# Patient Record
Sex: Female | Born: 1985 | Race: White | Hispanic: No | Marital: Married | Smoking: Never smoker
Health system: Southern US, Community
[De-identification: ages and names within clinical notes are randomized; demographics above are authoritative.]

## PROBLEM LIST (undated history)

## (undated) DIAGNOSIS — K429 Umbilical hernia without obstruction or gangrene: Secondary | ICD-10-CM

## (undated) DIAGNOSIS — T8332XA Displacement of intrauterine contraceptive device, initial encounter: Secondary | ICD-10-CM

## (undated) DIAGNOSIS — G43909 Migraine, unspecified, not intractable, without status migrainosus: Secondary | ICD-10-CM

## (undated) DIAGNOSIS — E7800 Pure hypercholesterolemia, unspecified: Secondary | ICD-10-CM

## (undated) DIAGNOSIS — K449 Diaphragmatic hernia without obstruction or gangrene: Secondary | ICD-10-CM

## (undated) DIAGNOSIS — Z87898 Personal history of other specified conditions: Secondary | ICD-10-CM

## (undated) DIAGNOSIS — K219 Gastro-esophageal reflux disease without esophagitis: Secondary | ICD-10-CM

## (undated) DIAGNOSIS — R519 Headache, unspecified: Secondary | ICD-10-CM

## (undated) HISTORY — PX: WISDOM TOOTH EXTRACTION: SHX21

## (undated) HISTORY — DX: Migraine, unspecified, not intractable, without status migrainosus: G43.909

## (undated) HISTORY — DX: Pure hypercholesterolemia, unspecified: E78.00

---

## 2010-10-09 DIAGNOSIS — Z8669 Personal history of other diseases of the nervous system and sense organs: Secondary | ICD-10-CM

## 2010-10-09 HISTORY — DX: Personal history of other diseases of the nervous system and sense organs: Z86.69

## 2011-01-09 ENCOUNTER — Ambulatory Visit (INDEPENDENT_AMBULATORY_CARE_PROVIDER_SITE_OTHER): Payer: BC Managed Care – PPO | Admitting: Family Medicine

## 2011-01-09 ENCOUNTER — Encounter: Payer: Self-pay | Admitting: Family Medicine

## 2011-01-09 DIAGNOSIS — R748 Abnormal levels of other serum enzymes: Secondary | ICD-10-CM

## 2011-01-09 DIAGNOSIS — G43909 Migraine, unspecified, not intractable, without status migrainosus: Secondary | ICD-10-CM | POA: Insufficient documentation

## 2011-01-09 DIAGNOSIS — E7800 Pure hypercholesterolemia, unspecified: Secondary | ICD-10-CM | POA: Insufficient documentation

## 2011-01-09 NOTE — Patient Instructions (Signed)
Can go for labs anytime, Mon-Friday. They open at 8AM.   We will call you the results.

## 2011-01-09 NOTE — Progress Notes (Signed)
  Subjective:    Patient ID: Abigail Hughes, female    DOB: Apr 15, 1986, 25 y.o.   MRN: [id number]  HPI  Had bloodwork drawn at work and told her lipids are high.  Moved to the area 2 years ago. Brought in labs done in 12-15-2010. No abdominal pain. Feels healthy. No liver dz in the family. occ drink alcohol but not daily. Has changed her diet in the last year and does eat more fatty foods since got married.  Review of Systems   BP 110/62  Pulse 84  Ht 5\' 5"  (1.651 m)  Wt 130 lb (58.968 kg)  BMI 21.63 kg/m2    No Known Allergies  Past Medical History  Diagnosis Date  . High blood cholesterol level     recent lab results    History reviewed. No pertinent past surgical history.  History   Social History  . Marital Status: Married    Spouse Name: Caprice Mccaffrey    Number of Children: N/A  . Years of Education: Masters    Occupational History  . Teacher      [employer]   Social History Main Topics  . Smoking status: Never Smoker   . Smokeless tobacco: Not on file  . Alcohol Use: 1.8 oz/week    3 Glasses of wine per week  . Drug Use: No  . Sexually Active: Yes -- Female partner(s)    Birth Control/ Protection: Pill   Other Topics Concern  . Not on file   Social History Narrative  . No narrative on file    Family History  Problem Relation Age of Onset  . Lymphoma Father   . Hyperlipidemia Father   . Hypertension      grandparents    Current outpatient prescriptions:Norethin Ace-Eth Estrad-FE (LOESTRIN 24 FE PO), Take by mouth.  , Disp: , Rfl: ;  SUMAtriptan (IMITREX) 100 MG tablet, Take 100 mg by mouth every 2 (two) hours as needed.  , Disp: , Rfl:      Objective:   Physical Exam  Constitutional: She appears well-developed and well-nourished.  HENT:  Head: Normocephalic.  Neck: Normal range of motion. Neck supple. No thyromegaly present.  Cardiovascular: Normal rate and regular rhythm.   Pulmonary/Chest: Effort normal and breath sounds normal.         Assessment & Plan:  Elevated LIver enezymes - Will recheck in 2 weeks off of all tylenol products and alcohol. If still elevated then check Acute Hep panel.

## 2011-01-09 NOTE — Assessment & Plan Note (Signed)
She really is just above goal. + family hx. Will recheck now that she has made some dietary changes.

## 2011-01-18 ENCOUNTER — Telehealth: Payer: Self-pay | Admitting: Family Medicine

## 2011-01-18 LAB — HEPATIC FUNCTION PANEL
ALT: 15 U/L (ref 0–35)
Alkaline Phosphatase: 67 U/L (ref 39–117)
Indirect Bilirubin: 0.2 mg/dL (ref 0.0–0.9)
Total Protein: 6.5 g/dL (ref 6.0–8.3)

## 2011-01-18 NOTE — Telephone Encounter (Signed)
Call pt: LDL is OK, in the 120s which is above goal but not high enough for medicine. Liver function is back to  Nl. Recheck liver in 6 months.

## 2011-01-18 NOTE — Telephone Encounter (Signed)
Left message on vm with results  

## 2011-03-01 ENCOUNTER — Encounter: Payer: Self-pay | Admitting: Family Medicine

## 2012-10-07 ENCOUNTER — Telehealth: Payer: Self-pay | Admitting: Family Medicine

## 2012-10-07 NOTE — Telephone Encounter (Signed)
Call pt: she is using her rescue meds a lot for her migraines. She really needs to be on prophylaxis to help prevent her migraines. Encourage her to make an OV.

## 2012-10-08 NOTE — Telephone Encounter (Signed)
Called patient, unable to leave a message

## 2012-10-14 NOTE — Telephone Encounter (Signed)
Mailed letter advising patient to call us in regards to her medication.

## 2013-10-14 ENCOUNTER — Ambulatory Visit: Payer: Self-pay | Admitting: Certified Nurse Midwife

## 2013-10-27 ENCOUNTER — Encounter: Payer: Self-pay | Admitting: Certified Nurse Midwife

## 2013-10-28 ENCOUNTER — Encounter: Payer: Self-pay | Admitting: Certified Nurse Midwife

## 2013-10-28 ENCOUNTER — Ambulatory Visit (INDEPENDENT_AMBULATORY_CARE_PROVIDER_SITE_OTHER): Payer: BC Managed Care – PPO | Admitting: Certified Nurse Midwife

## 2013-10-28 VITALS — BP 96/60 | HR 71 | Resp 16 | Ht 63.75 in | Wt 139.0 lb

## 2013-10-28 DIAGNOSIS — Z01419 Encounter for gynecological examination (general) (routine) without abnormal findings: Secondary | ICD-10-CM

## 2013-10-28 DIAGNOSIS — Z309 Encounter for contraceptive management, unspecified: Secondary | ICD-10-CM

## 2013-10-28 DIAGNOSIS — Z0000 Encounter for general adult medical examination without abnormal findings: Secondary | ICD-10-CM

## 2013-10-28 LAB — POCT URINALYSIS DIPSTICK
BILIRUBIN UA: NEGATIVE
Blood, UA: NEGATIVE
GLUCOSE UA: NEGATIVE
Ketones, UA: NEGATIVE
LEUKOCYTES UA: NEGATIVE
NITRITE UA: NEGATIVE
Protein, UA: NEGATIVE
UROBILINOGEN UA: NEGATIVE
pH, UA: 5

## 2013-10-28 MED ORDER — NORETHINDRONE 0.35 MG PO TABS: 1.0000 | ORAL_TABLET | Freq: Every day | ORAL | Status: DC

## 2013-10-28 NOTE — Progress Notes (Signed)
28 y.o. [name]000 Married Caucasian Fe here for annual exam. Periods scant with Micronor until this past period which was slightly heavier, no issues. No health issues today. Sees PCP prn for labs.  Patient's last menstrual period was 10/27/2013.          Sexually active: yes  The current method of family planning is oral progesterone-only contraceptive.    Exercising: yes  running & strength Smoker:  no  Health Maintenance: Pap:  09-19-10 neg MMG:  none Colonoscopy:  none BMD:   none TDaP:  2008 Labs: Poct urine-neg Self breast exam: done occ   reports that she has never smoked. She does not have any smokeless tobacco history on file. She reports that she drinks about 1.2 ounces of alcohol per week. She reports that she does not use illicit drugs.  Past Medical History  Diagnosis Date  . High blood cholesterol level     2012 lab results, tested again with normal levels  . Migraines     no aura    History reviewed. No pertinent past surgical history.  Current Outpatient Prescriptions  Medication Sig Dispense Refill  . norethindrone (MICRONOR,CAMILA,ERRIN) 0.35 MG tablet Take 1 tablet by mouth daily.      . RELPAX 40 MG tablet as needed.       No current facility-administered medications for this visit.    Family History  Problem Relation Age of Onset  . Lymphoma Father   . Hyperlipidemia Father   . Hypertension Maternal Grandmother     ROS:  Pertinent items are noted in HPI.  Otherwise, a comprehensive ROS was negative.  Exam:   BP 96/60  Pulse 71  Resp 16  Ht 5' 4.25" (1.632 m)  Wt 139 lb (63.05 kg)  BMI 23.67 kg/m2  LMP 10/27/2013 Height: 5' 4.25" (163.2 cm)  Ht Readings from Last 3 Encounters:  10/28/13 5' 4.25" (1.632 m)  01/09/11 5\' 5"  (1.651 m)    General appearance: alert, cooperative and appears stated age Head: Normocephalic, without obvious abnormality, atraumatic Neck: no adenopathy, supple, symmetrical, trachea midline and thyroid normal to  inspection and palpation Lungs: clear to auscultation bilaterally Breasts: normal appearance, no masses or tenderness, No nipple retraction or dimpling, No nipple discharge or bleeding, No axillary or supraclavicular adenopathy Heart: regular rate and rhythm Abdomen: soft, non-tender; no masses,  no organomegaly Extremities: extremities normal, atraumatic, no cyanosis or edema Skin: Skin color, texture, turgor normal. No rashes or lesions Lymph nodes: Cervical, supraclavicular, and axillary nodes normal. No abnormal inguinal nodes palpated Neurologic: Grossly normal   Pelvic: External genitalia:  no lesions              Urethra:  normal appearing urethra with no masses, tenderness or lesions              Bartholin's and Skene's: normal                 Vagina: normal appearing vagina with normal color and discharge, no lesions              Cervix: normal, non tender, scant blood present              Pap taken: yes Bimanual Exam:  Uterus:  normal size, contour, position, consistency, mobility, non-tender and anteverted              Adnexa: normal adnexa and no mass, fullness, tenderness  Rectovaginal: Confirms               Anus:  deferred  A:  Well Woman with normal exam  Contraception POP due to migraine headache  P:   Reviewed health and wellness pertinent to exam  Rx Micronor see order  Pap smear as per guidelines   pap smear taken today with reflex  counseled on breast self exam, adequate intake of calcium and vitamin D, diet and exercise  return annually or prn  An After Visit Summary was printed and given to the patient.

## 2013-10-28 NOTE — Patient Instructions (Signed)
General topics  Next pap or exam is  due in 1 year Take a Women's multivitamin Take 1200 mg. of calcium daily - prefer dietary If any concerns in interim to call back  Breast Self-Awareness Practicing breast self-awareness may pick up problems early, prevent significant medical complications, and possibly save your life. By practicing breast self-awareness, you can become familiar with how your breasts look and feel and if your breasts are changing. This allows you to notice changes early. It can also offer you some reassurance that your breast health is good. One way to learn what is normal for your breasts and whether your breasts are changing is to do a breast self-exam. If you find a lump or something that was not present in the past, it is best to contact your caregiver right away. Other findings that should be evaluated by your caregiver include nipple discharge, especially if it is bloody; skin changes or reddening; areas where the skin seems to be pulled in (retracted); or new lumps and bumps. Breast pain is seldom associated with cancer (malignancy), but should also be evaluated by a caregiver. BREAST SELF-EXAM The best time to examine your breasts is 5 7 days after your menstrual period is over.  ExitCare Patient Information 2013 [employer].   Exercise to Stay Healthy Exercise helps you become and stay healthy. EXERCISE IDEAS AND TIPS Choose exercises that:  You enjoy.  Fit into your day. You do not need to exercise really hard to be healthy. You can do exercises at a slow or medium level and stay healthy. You can:  Stretch before and after working out.  Try yoga, Pilates, or tai chi.  Lift weights.  Walk fast, swim, jog, run, climb stairs, bicycle, dance, or rollerskate.  Take aerobic classes. Exercises that burn about 150 calories:  Running 1  miles in 15 minutes.  Playing volleyball for 45 to 60 minutes.  Washing and waxing a car for 45 to 60  minutes.  Playing touch football for 45 minutes.  Walking 1  miles in 35 minutes.  Pushing a stroller 1  miles in 30 minutes.  Playing basketball for 30 minutes.  Raking leaves for 30 minutes.  Bicycling 5 miles in 30 minutes.  Walking 2 miles in 30 minutes.  Dancing for 30 minutes.  Shoveling snow for 15 minutes.  Swimming laps for 20 minutes.  Walking up stairs for 15 minutes.  Bicycling 4 miles in 15 minutes.  Gardening for 30 to 45 minutes.  Jumping rope for 15 minutes.  Washing windows or floors for 45 to 60 minutes. Document Released: 10/28/2010 Document Revised: 12/18/2011 Document Reviewed: 10/28/2010 ExitCare Patient Information 2013 [employer].   Other topics ( that may be useful information):    Sexually Transmitted Disease Sexually transmitted disease (STD) refers to any infection that is passed from person to person during sexual activity. This may happen by way of saliva, semen, blood, vaginal mucus, or urine. Common STDs include:  Gonorrhea.  Chlamydia.  Syphilis.  HIV/AIDS.  Genital herpes.  Hepatitis B and C.  Trichomonas.  Human papillomavirus (HPV).  Pubic lice. CAUSES  An STD may be spread by bacteria, virus, or parasite. A person can get an STD by:  Sexual intercourse with an infected person.  Sharing sex toys with an infected person.  Sharing needles with an infected person.  Having intimate contact with the genitals, mouth, or rectal areas of an infected person. SYMPTOMS  Some people may not have any symptoms, but   they can still pass the infection to others. Different STDs have different symptoms. Symptoms include:  Painful or bloody urination.  Pain in the pelvis, abdomen, vagina, anus, throat, or eyes.  Skin rash, itching, irritation, growths, or sores (lesions). These usually occur in the genital or anal area.  Abnormal vaginal discharge.  Penile discharge in men.  Soft, flesh-colored skin growths in the  genital or anal area.  Fever.  Pain or bleeding during sexual intercourse.  Swollen glands in the groin area.  Yellow skin and eyes (jaundice). This is seen with hepatitis. DIAGNOSIS  To make a diagnosis, your caregiver may:  Take a medical history.  Perform a physical exam.  Take a specimen (culture) to be examined.  Examine a sample of discharge under a microscope.  Perform blood test TREATMENT   Chlamydia, gonorrhea, trichomonas, and syphilis can be cured with antibiotic medicine.  Genital herpes, hepatitis, and HIV can be treated, but not cured, with prescribed medicines. The medicines will lessen the symptoms.  Genital warts from HPV can be treated with medicine or by freezing, burning (electrocautery), or surgery. Warts may come back.  HPV is a virus and cannot be cured with medicine or surgery.However, abnormal areas may be followed very closely by your caregiver and may be removed from the cervix, vagina, or vulva through office procedures or surgery. If your diagnosis is confirmed, your recent sexual partners need treatment. This is true even if they are symptom-free or have a negative culture or evaluation. They should not have sex until their caregiver says it is okay. HOME CARE INSTRUCTIONS  All sexual partners should be informed, tested, and treated for all STDs.  Take your antibiotics as directed. Finish them even if you start to feel better.  Only take over-the-counter or prescription medicines for pain, discomfort, or fever as directed by your caregiver.  Rest.  Eat a balanced diet and drink enough fluids to keep your urine clear or pale yellow.  Do not have sex until treatment is completed and you have followed up with your caregiver. STDs should be checked after treatment.  Keep all follow-up appointments, Pap tests, and blood tests as directed by your caregiver.  Only use latex condoms and water-soluble lubricants during sexual activity. Do not use  petroleum jelly or oils.  Avoid alcohol and illegal drugs.  Get vaccinated for HPV and hepatitis. If you have not received these vaccines in the past, talk to your caregiver about whether one or both might be right for you.  Avoid risky sex practices that can break the skin. The only way to avoid getting an STD is to avoid all sexual activity.Latex condoms and dental dams (for oral sex) will help lessen the risk of getting an STD, but will not completely eliminate the risk. SEEK MEDICAL CARE IF:   You have a fever.  You have any new or worsening symptoms. Document Released: 12/16/2002 Document Revised: 12/18/2011 Document Reviewed: 12/23/2010 [facility] Patient Information 2013 [employer].    Domestic Abuse You are being battered or abused if someone close to you hits, pushes, or physically hurts you in any way. You also are being abused if you are forced into activities. You are being sexually abused if you are forced to have sexual contact of any kind. You are being emotionally abused if you are made to feel worthless or if you are constantly threatened. It is important to remember that help is available. No one has the right to abuse you. PREVENTION OF FURTHER  ABUSE  Learn the warning signs of danger. This varies with situations but may include: the use of alcohol, threats, isolation from friends and family, or forced sexual contact. Leave if you feel that violence is going to occur.  If you are attacked or beaten, report it to the police so the abuse is documented. You do not have to press charges. The police can protect you while you or the attackers are leaving. Get the officer's name and badge number and a copy of the report.  Find someone you can trust and tell them what is happening to you: your caregiver, a nurse, clergy member, close friend or family member. Feeling ashamed is natural, but remember that you have done nothing wrong. No one deserves abuse. Document Released:  09/22/2000 Document Revised: 12/18/2011 Document Reviewed: 12/01/2010 ExitCare Patient Information 2013 [employer].    How Much is Too Much Alcohol? Drinking too much alcohol can cause injury, accidents, and health problems. These types of problems can include:   Car crashes.  Falls.  Family fighting (domestic violence).  Drowning.  Fights.  Injuries.  Burns.  Damage to certain organs.  Having a baby with birth defects. ONE DRINK CAN BE TOO MUCH WHEN YOU ARE:  Working.  Pregnant or breastfeeding.  Taking medicines. Ask your doctor.  Driving or planning to drive. If you or someone you know has a drinking problem, get help from a doctor.  Document Released: 07/22/2009 Document Revised: 12/18/2011 Document Reviewed: 07/22/2009 ExitCare Patient Information 2013 [employer].   Smoking Hazards Smoking cigarettes is extremely bad for your health. Tobacco smoke has over 200 known poisons in it. There are over 60 chemicals in tobacco smoke that cause cancer. Some of the chemicals found in cigarette smoke include:   Cyanide.  Benzene.  Formaldehyde.  Methanol (wood alcohol).  Acetylene (fuel used in welding torches).  Ammonia. Cigarette smoke also contains the poisonous gases nitrogen oxide and carbon monoxide.  Cigarette smokers have an increased risk of many serious medical problems and Smoking causes approximately:  90% of all lung cancer deaths in men.  80% of all lung cancer deaths in women.  90% of deaths from chronic obstructive lung disease. Compared with nonsmokers, smoking increases the risk of:  Coronary heart disease by 2 to 4 times.  Stroke by 2 to 4 times.  Men developing lung cancer by 23 times.  Women developing lung cancer by 13 times.  Dying from chronic obstructive lung diseases by 12 times.  . Smoking is the most preventable cause of death and disease in our society.  WHY IS SMOKING ADDICTIVE?  Nicotine is the chemical  agent in tobacco that is capable of causing addiction or dependence.  When you smoke and inhale, nicotine is absorbed rapidly into the bloodstream through your lungs. Nicotine absorbed through the lungs is capable of creating a powerful addiction. Both inhaled and non-inhaled nicotine may be addictive.  Addiction studies of cigarettes and spit tobacco show that addiction to nicotine occurs mainly during the teen years, when young people begin using tobacco products. WHAT ARE THE BENEFITS OF QUITTING?  There are many health benefits to quitting smoking.   Likelihood of developing cancer and heart disease decreases. Health improvements are seen almost immediately.  Blood pressure, pulse rate, and breathing patterns start returning to normal soon after quitting. QUITTING SMOKING   American Lung Association - 1-800-LUNGUSA  American Cancer Society - 1-800-ACS-2345 Document Released: 11/02/2004 Document Revised: 12/18/2011 Document Reviewed: 07/07/2009 ExitCare Patient Information 2013 ExitCare,   LLC.   Stress Management Stress is a state of physical or mental tension that often results from changes in your life or normal routine. Some common causes of stress are:  Death of a loved one.  Injuries or severe illnesses.  Getting fired or changing jobs.  Moving into a new home. Other causes may be:  Sexual problems.  Business or financial losses.  Taking on a large debt.  Regular conflict with someone at home or at work.  Constant tiredness from lack of sleep. It is not just bad things that are stressful. It may be stressful to:  Win the lottery.  Get married.  Buy a new car. The amount of stress that can be easily tolerated varies from person to person. Changes generally cause stress, regardless of the types of change. Too much stress can affect your health. It may lead to physical or emotional problems. Too little stress (boredom) may also become stressful. SUGGESTIONS TO  REDUCE STRESS:  Talk things over with your family and friends. It often is helpful to share your concerns and worries. If you feel your problem is serious, you may want to get help from a professional counselor.  Consider your problems one at a time instead of lumping them all together. Trying to take care of everything at once may seem impossible. List all the things you need to do and then start with the most important one. Set a goal to accomplish 2 or 3 things each day. If you expect to do too many in a single day you will naturally fail, causing you to feel even more stressed.  Do not use alcohol or drugs to relieve stress. Although you may feel better for a short time, they do not remove the problems that caused the stress. They can also be habit forming.  Exercise regularly - at least 3 times per week. Physical exercise can help to relieve that "uptight" feeling and will relax you.  The shortest distance between despair and hope is often a good night's sleep.  Go to bed and get up on time allowing yourself time for appointments without being rushed.  Take a short "time-out" period from any stressful situation that occurs during the day. Close your eyes and take some deep breaths. Starting with the muscles in your face, tense them, hold it for a few seconds, then relax. Repeat this with the muscles in your neck, shoulders, hand, stomach, back and legs.  Take good care of yourself. Eat a balanced diet and get plenty of rest.  Schedule time for having fun. Take a break from your daily routine to relax. HOME CARE INSTRUCTIONS   Call if you feel overwhelmed by your problems and feel you can no longer manage them on your own.  Return immediately if you feel like hurting yourself or someone else. Document Released: 03/21/2001 Document Revised: 12/18/2011 Document Reviewed: 11/11/2007 ExitCare Patient Information 2013 [employer]Care[employer].   

## 2013-10-28 NOTE — Progress Notes (Signed)
Reviewed personally.  M. Suzanne Osker Ayoub, MD.  

## 2013-10-29 NOTE — Addendum Note (Signed)
Addended by: Regina Eck on: 10/29/2013 09:23 AM   Modules accepted: Orders

## 2013-10-30 LAB — IPS PAP TEST WITH REFLEX TO HPV

## 2013-12-19 ENCOUNTER — Other Ambulatory Visit: Payer: Self-pay | Admitting: Certified Nurse Midwife

## 2014-11-02 ENCOUNTER — Ambulatory Visit: Payer: BC Managed Care – PPO | Admitting: Certified Nurse Midwife

## 2014-11-26 ENCOUNTER — Other Ambulatory Visit: Payer: Self-pay | Admitting: Certified Nurse Midwife

## 2014-11-26 NOTE — Telephone Encounter (Signed)
Medication refill request: Abigail Hughes 0.35 mg  Last AEX:  10/28/13 with DL Next AEX: 12/30/14 with DL Last MMG (if hormonal medication request): N/A Refill authorized: #[id number] rfs, please advise.

## 2014-12-30 ENCOUNTER — Ambulatory Visit (INDEPENDENT_AMBULATORY_CARE_PROVIDER_SITE_OTHER): Payer: BC Managed Care – PPO | Admitting: Certified Nurse Midwife

## 2014-12-30 ENCOUNTER — Encounter: Payer: Self-pay | Admitting: Certified Nurse Midwife

## 2014-12-30 VITALS — BP 108/66 | HR 70 | Resp 16 | Ht 63.75 in | Wt 144.0 lb

## 2014-12-30 DIAGNOSIS — Z01419 Encounter for gynecological examination (general) (routine) without abnormal findings: Secondary | ICD-10-CM

## 2014-12-30 DIAGNOSIS — Z0000 Encounter for general adult medical examination without abnormal findings: Secondary | ICD-10-CM | POA: Diagnosis not present

## 2014-12-30 DIAGNOSIS — Z3041 Encounter for surveillance of contraceptive pills: Secondary | ICD-10-CM | POA: Diagnosis not present

## 2014-12-30 LAB — POCT URINALYSIS DIPSTICK
Bilirubin, UA: NEGATIVE
Glucose, UA: NEGATIVE
Ketones, UA: NEGATIVE
LEUKOCYTES UA: NEGATIVE
NITRITE UA: NEGATIVE
PROTEIN UA: NEGATIVE
RBC UA: NEGATIVE
Urobilinogen, UA: NEGATIVE
pH, UA: 5

## 2014-12-30 MED ORDER — NORETHINDRONE 0.35 MG PO TABS: 1.0000 | ORAL_TABLET | Freq: Every day | ORAL | Status: DC

## 2014-12-30 NOTE — Progress Notes (Signed)
29 y.o. [name]000 Married  Caucasian Fe here for annual exam. Periods normal, no issues. Contraception OCP working well, no issues. Migraines less, feels exercise is helping. Enjoys running. Planning trip to [place] for 5 year anniversary!  May decide to try for pregnancy next year. Plans preconceptual visit prior to trying.  Patient's last menstrual period was 12/28/2014.          Sexually active: Yes.    The current method of family planning is oral progesterone-only contraceptive.    Exercising: Yes.    running Smoker:  no  Health Maintenance: Pap:  10-29-13 neg MMG:  none Colonoscopy:  none BMD:   none TDaP:  2008 Labs: none Self breast exam: done occ   reports that she has never smoked. She does not have any smokeless tobacco history on file. She reports that she drinks about 2.4 oz of alcohol per week. She reports that she does not use illicit drugs.  Past Medical History  Diagnosis Date  . High blood cholesterol level     2012 lab results, tested again with normal levels  . Migraines     no aura    History reviewed. No pertinent past surgical history.  Current Outpatient Prescriptions  Medication Sig Dispense Refill  . CAMILA 0.35 MG tablet TAKE 1 TABLET BY MOUTH DAILY 84 tablet 0  . RELPAX 40 MG tablet as needed.     No current facility-administered medications for this visit.    Family History  Problem Relation Age of Onset  . Lymphoma Father   . Hyperlipidemia Father   . Hypertension Maternal Grandmother     ROS:  Pertinent items are noted in HPI.  Otherwise, a comprehensive ROS was negative.  Exam:   BP 108/66 mmHg  Pulse 70  Resp 16  Ht 5' 3.75" (1.619 m)  Wt 144 lb (65.318 kg)  BMI 24.92 kg/m2  LMP 12/28/2014 Height: 5' 3.75" (161.9 cm) Ht Readings from Last 3 Encounters:  12/30/14 5' 3.75" (1.619 m)  10/28/13 5' 4.25" (1.632 m)  01/09/11 5\' 5"  (1.651 m)    General appearance: alert, cooperative and appears stated age Head: Normocephalic,  without obvious abnormality, atraumatic Neck: no adenopathy, supple, symmetrical, trachea midline and thyroid normal to inspection and palpation Lungs: clear to auscultation bilaterally Breasts: normal appearance, no masses or tenderness, No nipple retraction or dimpling, No nipple discharge or bleeding, No axillary or supraclavicular adenopathy Heart: regular rate and rhythm Abdomen: soft, non-tender; no masses,  no organomegaly Extremities: extremities normal, atraumatic, no cyanosis or edema Skin: Skin color, texture, turgor normal. No rashes or lesions Lymph nodes: Cervical, supraclavicular, and axillary nodes normal. No abnormal inguinal nodes palpated Neurologic: Grossly normal   Pelvic: External genitalia:  no lesions              Urethra:  normal appearing urethra with no masses, tenderness or lesions              Bartholin's and Skene's: normal                 Vagina: normal appearing vagina with normal color and discharge, no lesions              Cervix: normal, non tender, no lesions              Pap taken: No. Bimanual Exam:  Uterus:  normal size, contour, position, consistency, mobility, non-tender              Adnexa: normal adnexa  and no mass, fullness, tenderness               Rectovaginal: Confirms               Anus:  normal sphincter tone, no lesions  Chaperone present: Yes  A:  Well Woman with normal exam  Contraception POP desired due to migraine  headaches  P:   Reviewed health and wellness pertinent to exam  Rx Camilla see order  Pap smear not  taken today   counseled on breast self exam, adequate intake of calcium and vitamin D, diet and exercise  return annually or prn  An After Visit Summary was printed and given to the patient.

## 2014-12-30 NOTE — Patient Instructions (Signed)

## 2014-12-30 NOTE — Addendum Note (Signed)
Addended by: Susy Manor on: 12/30/2014 04:08 PM   Modules accepted: Miquel Dunn

## 2014-12-30 NOTE — Addendum Note (Signed)
Addended by: Susy Manor on: 12/30/2014 04:10 PM   Modules accepted: Miquel Dunn

## 2015-01-06 NOTE — Progress Notes (Signed)
Reviewed personally.  M. Suzanne Felicite Zeimet, MD.  

## 2015-08-13 ENCOUNTER — Telehealth: Payer: Self-pay | Admitting: Certified Nurse Midwife

## 2015-08-13 NOTE — Telephone Encounter (Signed)
Left message to call Abigail Hughes at [phone]. 

## 2015-08-13 NOTE — Telephone Encounter (Signed)
Spoke with patient. Patient states her LMP was 07/10/2015. Took UPT that was positive. Would like to come in to see Melvia Heaps CNM for confirmation. Appointment scheduled for 08/20/2015 at 10 am with Melvia Heaps CNM. Agreeable to date and time.  Routing to provider for final review. Patient agreeable to disposition. Will close encounter.

## 2015-08-13 NOTE — Telephone Encounter (Signed)
Patient just found out she is pregnant would like to schedule appointment with Ms. Debbie to confirm. Best # to reach [phone]

## 2015-08-20 ENCOUNTER — Ambulatory Visit (INDEPENDENT_AMBULATORY_CARE_PROVIDER_SITE_OTHER): Payer: BC Managed Care – PPO | Admitting: Certified Nurse Midwife

## 2015-08-20 ENCOUNTER — Encounter: Payer: Self-pay | Admitting: Certified Nurse Midwife

## 2015-08-20 VITALS — BP 110/74 | HR 70 | Resp 16 | Ht 63.75 in | Wt 146.0 lb

## 2015-08-20 DIAGNOSIS — N912 Amenorrhea, unspecified: Secondary | ICD-10-CM | POA: Diagnosis not present

## 2015-08-20 DIAGNOSIS — Z3201 Encounter for pregnancy test, result positive: Secondary | ICD-10-CM

## 2015-08-20 NOTE — Progress Notes (Signed)
29 y.o.married female g0p0 presents with amenorrhea with + UPT on 08/13/15. LMP 07/10/15.. Stopped OCP in 5/16. Planned pregnancy. Complaining of breast tenderness, fatigue, nausea. Denies spotting, bleeding or cramping. Medications she is taking are: Alieve X 1 and Prenatal vitamins.  Patient has not consumed alcohol since + UPT. Spouse supportive.   O: HPI pertinent to above. [name] Affect: normal, orientation x 3  Last Aex:3/16 Pap smear: negative              A: Amenorrhea with positive UPT  5 wk 6 days per LNMP with EDC  04-18-15 Planned pregnancy   P: Reviewed with patient importance of prenatal care during pregnancy. Given OB provider list. Reviewed nutrition importance of pregnancy and selecting from all food groups and making sure to have adequate protein intake daily. Discussed avoiding raw or exotic fish, soft cheeses due to risk of bacteria . Discussed concerns with FAS with alcohol use in pregnancy. Discussed increase of IUGR and SIDS with smoking use or second smoke. Reviewed warning signs of early pregnancy and need to advise if occurs. Discussed comfort measures for early pregnancy changes. Offered viability PUS here prior to initiating prenatal care. Patient would like to have  PUS. She will be called with insurance information and scheduled. Questions addressed at length.    Rv prn  Time spent with patient in face to face counseling regarding pregnancy and prenatal care was 35 minutes

## 2015-08-20 NOTE — Patient Instructions (Signed)
Prenatal Care °WHAT IS PRENATAL CARE?  °Prenatal care is the process of caring for a pregnant woman before she gives birth. Prenatal care makes sure that she and her baby remain as healthy as possible throughout pregnancy. Prenatal care may be provided by a midwife, family practice health care provider, or a childbirth and pregnancy specialist (obstetrician). Prenatal care may include physical examinations, testing, treatments, and education on nutrition, lifestyle, and social support services. °WHY IS PRENATAL CARE SO IMPORTANT?  °Early and consistent prenatal care increases the chance that you and your baby will remain healthy throughout your pregnancy. This type of care also decreases a baby's risk of being born too early (prematurely), or being born smaller than expected (small for gestational age). Any underlying medical conditions you may have that could pose a risk during your pregnancy are discussed during prenatal care visits. You will also be monitored regularly for any new conditions that may arise during your pregnancy so they can be treated quickly and effectively. °WHAT HAPPENS DURING PRENATAL CARE VISITS? °Prenatal care visits may include the following: °Discussion °Tell your health care provider about any new signs or symptoms you have experienced since your last visit. These might include: °· Nausea or vomiting. °· Increased or decreased level of energy. °· Difficulty sleeping. °· Back or leg pain. °· Weight changes. °· Frequent urination. °· Shortness of breath with physical activity. °· Changes in your skin, such as the development of a rash or itchiness. °· Vaginal discharge or bleeding. °· Feelings of excitement or nervousness. °· Changes in your baby's movements. °You may want to write down any questions or topics you want to discuss with your health care provider and bring them with you to your appointment. °Examination °During your first prenatal care visit, you will likely have a complete  physical exam. Your health care provider will often examine your vagina, cervix, and the position of your uterus, as well as check your heart, lungs, and other body systems. As your pregnancy progresses, your health care provider will measure the size of your uterus and your baby's position inside your uterus. He or she may also examine you for early signs of labor. Your prenatal visits may also include checking your blood pressure and, after about 10-12 weeks of pregnancy, listening to your baby's heartbeat. °Testing °Regular testing often includes: °· Urinalysis. This checks your urine for glucose, protein, or signs of infection. °· Blood count. This checks the levels of white and red blood cells in your body. °· Tests for sexually transmitted infections (STIs). Testing for STIs at the beginning of pregnancy is routinely done and is required in many states. °· Antibody testing. You will be checked to see if you are immune to certain illnesses, such as rubella, that can affect a developing fetus. °· Glucose screen. Around 24-28 weeks of pregnancy, your blood glucose level will be checked for signs of gestational diabetes. Follow-up tests may be recommended. °· Group B strep. This is a bacteria that is commonly found inside a woman's vagina. This test will inform your health care provider if you need an antibiotic to reduce the amount of this bacteria in your body prior to labor and childbirth. °· Ultrasound. Many pregnant women undergo an ultrasound screening around 18-20 weeks of pregnancy to evaluate the health of the fetus and check for any developmental abnormalities. °· HIV (human immunodeficiency virus) testing. Early in your pregnancy, you will be screened for HIV. If you are at high risk for HIV, this test   may be repeated during your third trimester of pregnancy. °You may be offered other testing based on your age, personal or family medical history, or other factors.  °HOW OFTEN SHOULD I PLAN TO SEE MY  HEALTH CARE PROVIDER FOR PRENATAL CARE? °Your prenatal care check-up schedule depends on any medical conditions you have before, or develop during, your pregnancy. If you do not have any underlying medical conditions, you will likely be seen for checkups: °· Monthly, during the first 6 months of pregnancy. °· Twice a month during months 7 and 8 of pregnancy. °· Weekly starting in the 9th month of pregnancy and until delivery. °If you develop signs of early labor or other concerning signs or symptoms, you may need to see your health care provider more often. Ask your health care provider what prenatal care schedule is best for you. °WHAT CAN I DO TO KEEP MYSELF AND MY BABY AS HEALTHY AS POSSIBLE DURING MY PREGNANCY? °· Take a prenatal vitamin containing 400 micrograms (0.4 mg) of folic acid every day. Your health care provider may also ask you to take additional vitamins such as iodine, vitamin D, iron, copper, and zinc. °· Take 1500-2000 mg of calcium daily starting at your 20th week of pregnancy until you deliver your baby. °· Make sure you are up to date on your vaccinations. Unless directed otherwise by your health care provider: °¨ You should receive a tetanus, diphtheria, and pertussis (Tdap) vaccination between the 27th and 36th week of your pregnancy, regardless of when your last Tdap immunization occurred. This helps protect your baby from whooping cough (pertussis) after he or she is born. °¨ You should receive an annual inactivated influenza vaccine (IIV) to help protect you and your baby from influenza. This can be done at any point during your pregnancy. °· Eat a well-rounded diet that includes: °¨ Fresh fruits and vegetables. °¨ Lean proteins. °¨ Calcium-rich foods such as milk, yogurt, hard cheeses, and dark, leafy greens. °¨ Whole grain breads. °· Do not eat seafood high in mercury, including: °¨ Swordfish. °¨ Tilefish. °¨ Shark. °¨ King mackerel. °¨ More than 6 oz tuna per week. °· Do not eat: °¨ Raw  or undercooked meats or eggs. °¨ Unpasteurized foods, such as soft cheeses (brie, blue, or feta), juices, and milks. °¨ Lunch meats. °¨ Hot dogs that have not been heated until they are steaming. °· Drink enough water to keep your urine clear or pale yellow. For many women, this may be 10 or more 8 oz glasses of water each day. Keeping yourself hydrated helps deliver nutrients to your baby and may prevent the start of pre-term uterine contractions. °· Do not use any tobacco products including cigarettes, chewing tobacco, or electronic cigarettes. If you need help quitting, ask your health care provider. °· Do not drink beverages containing alcohol. No safe level of alcohol consumption during pregnancy has been determined. °· Do not use any illegal drugs. These can harm your developing baby or cause a miscarriage. °· Ask your health care provider or pharmacist before taking any prescription or over-the-counter medicines, herbs, or supplements. °· Limit your caffeine intake to no more than 200 mg per day. °· Exercise. Unless told otherwise by your health care provider, try to get 30 minutes of moderate exercise most days of the week. Do not  do high-impact activities, contact sports, or activities with a high risk of falling, such as horseback riding or downhill skiing. °· Get plenty of rest. °· Avoid anything that raises your   body temperature, such as hot tubs and saunas. °· If you own a cat, do not empty its litter box. Bacteria contained in cat feces can cause an infection called toxoplasmosis. This can result in serious harm to the fetus. °· Stay away from chemicals such as insecticides, lead, mercury, and cleaning or paint products that contain solvents. °· Do not have any X-rays taken unless medically necessary. °· Take a childbirth and breastfeeding preparation class. Ask your health care provider if you need a referral or recommendation. °  °This information is not intended to replace advice given to you by  your health care provider. Make sure you discuss any questions you have with your health care provider. °  °Document Released: 09/28/2003 Document Revised: 10/16/2014 Document Reviewed: 12/10/2013 °Elsevier Interactive Patient Education ©2016 [employer] Inc. ° °

## 2015-08-21 NOTE — Progress Notes (Signed)
Reviewed personally.  M. Suzanne Krisandra Bueno, MD.  

## 2015-08-23 ENCOUNTER — Telehealth: Payer: Self-pay | Admitting: Certified Nurse Midwife

## 2015-08-23 NOTE — Telephone Encounter (Signed)
Patient calling to check on status of pre-cert for an ultrasound.  °

## 2015-08-25 NOTE — Addendum Note (Signed)
Addended by: Michele Mcalpine on: 08/25/2015 11:56 AM   Modules accepted: Orders

## 2015-08-26 ENCOUNTER — Other Ambulatory Visit: Payer: Self-pay | Admitting: Obstetrics & Gynecology

## 2015-08-26 ENCOUNTER — Ambulatory Visit (INDEPENDENT_AMBULATORY_CARE_PROVIDER_SITE_OTHER): Payer: BC Managed Care – PPO

## 2015-08-26 ENCOUNTER — Ambulatory Visit (INDEPENDENT_AMBULATORY_CARE_PROVIDER_SITE_OTHER): Payer: BC Managed Care – PPO | Admitting: Obstetrics & Gynecology

## 2015-08-26 VITALS — BP 120/78 | HR 68 | Resp 16 | Wt 146.0 lb

## 2015-08-26 DIAGNOSIS — Z3201 Encounter for pregnancy test, result positive: Secondary | ICD-10-CM

## 2015-08-26 DIAGNOSIS — N912 Amenorrhea, unspecified: Secondary | ICD-10-CM | POA: Diagnosis not present

## 2015-08-26 NOTE — Progress Notes (Signed)
29 y.o. Abigail Hughes here for a pelvic ultrasound.  LMP 07/10/15 with EGA 6 5/7 weeks.  Denies vaginal bleeding.  She is having some mild nausea, breast tenderness and fatigue.    Patient's last menstrual period was 07/10/2015.  FINDINGS: UTERUS: [place] IUP noted measuring 0.49cm CRL.  EGA by PUS is 6 1/7 weeks but BOE is by LMP.  Normal gestational sac, yolk sac, and fetal pole noted.  FCA at 126 BPM. ADNEXA:   Left ovary 2.2 x 1.3cm   Right ovary 2.9 x 2.4cm with 1.5cm corpus luteal cyst noted CUL DE SAC: no free fluid Cervix measures 3.2cm and is closed  Findings reviewed.  Due date discussed.  Base on BOE of LMP, [place] is 04/16/15.    Pt reports there are no cats in the home.  Last 2008.  Aware she should get an update.  She did have chicken pox as a child.  Aware flu vaccine safe and recommended.   Fish/shellfish/mercury discuss.  Patient does not eat fish.  Unpasteurized cheese/juices discussed.  Nitrites in foods disucssed.  Exercise and intercourse discussed.  Fetal DNA particle testing discussed.  First trimester down's testing discussed.  Cystic fibrosis discussed.  Practice transfer discussed.  Pt considering a practice with midwives.  Assessment:  Abigail Hughes IUD at 6 5/7 weeks H/O migraines  Plan: Transferring care to [facility] office now.  Pt will call when she decides where she is going to go for release of records. Aware to not take any further relpax at this time.  ~15 minutes spent with patient >50% of time was in face to face discussion of above.

## 2015-08-29 ENCOUNTER — Encounter: Payer: Self-pay | Admitting: Obstetrics & Gynecology

## 2015-09-21 DIAGNOSIS — Z8669 Personal history of other diseases of the nervous system and sense organs: Secondary | ICD-10-CM | POA: Insufficient documentation

## 2015-10-12 DIAGNOSIS — Z3401 Encounter for supervision of normal first pregnancy, first trimester: Secondary | ICD-10-CM | POA: Insufficient documentation

## 2015-11-16 DIAGNOSIS — O3412 Maternal care for benign tumor of corpus uteri, second trimester: Secondary | ICD-10-CM | POA: Insufficient documentation

## 2015-11-16 DIAGNOSIS — D259 Leiomyoma of uterus, unspecified: Secondary | ICD-10-CM | POA: Insufficient documentation

## 2016-01-04 ENCOUNTER — Ambulatory Visit: Payer: BC Managed Care – PPO | Admitting: Certified Nurse Midwife

## 2016-04-08 DIAGNOSIS — E7800 Pure hypercholesterolemia, unspecified: Secondary | ICD-10-CM | POA: Insufficient documentation

## 2016-06-13 ENCOUNTER — Ambulatory Visit: Payer: BC Managed Care – PPO | Admitting: Certified Nurse Midwife

## 2017-05-03 ENCOUNTER — Emergency Department (HOSPITAL_COMMUNITY): Payer: BC Managed Care – PPO

## 2017-05-03 ENCOUNTER — Encounter (HOSPITAL_COMMUNITY): Payer: Self-pay | Admitting: Emergency Medicine

## 2017-05-03 ENCOUNTER — Emergency Department (HOSPITAL_COMMUNITY)
Admission: EM | Admit: 2017-05-03 | Discharge: 2017-05-03 | Disposition: A | Discharge status: Home / Self Care | Payer: BC Managed Care – PPO | Attending: Emergency Medicine | Admitting: Emergency Medicine

## 2017-05-03 DIAGNOSIS — Y92410 Unspecified street and highway as the place of occurrence of the external cause: Secondary | ICD-10-CM | POA: Diagnosis not present

## 2017-05-03 DIAGNOSIS — S52321A Displaced transverse fracture of shaft of right radius, initial encounter for closed fracture: Secondary | ICD-10-CM | POA: Diagnosis not present

## 2017-05-03 DIAGNOSIS — Y998 Other external cause status: Secondary | ICD-10-CM | POA: Insufficient documentation

## 2017-05-03 DIAGNOSIS — Y939 Activity, unspecified: Secondary | ICD-10-CM | POA: Diagnosis not present

## 2017-05-03 DIAGNOSIS — E7800 Pure hypercholesterolemia, unspecified: Secondary | ICD-10-CM | POA: Diagnosis not present

## 2017-05-03 DIAGNOSIS — S59911A Unspecified injury of right forearm, initial encounter: Secondary | ICD-10-CM | POA: Diagnosis present

## 2017-05-03 MED ORDER — OXYCODONE-ACETAMINOPHEN 5-325 MG PO TABS: 1.0000 | ORAL_TABLET | Freq: Four times a day (QID) | ORAL | 0 refills | Status: DC | PRN

## 2017-05-03 MED ORDER — IBUPROFEN 800 MG PO TABS: 800.0000 mg | ORAL_TABLET | Freq: Three times a day (TID) | ORAL | 0 refills | Status: DC

## 2017-05-03 MED ORDER — CYCLOBENZAPRINE HCL 10 MG PO TABS: 10.0000 mg | ORAL_TABLET | Freq: Two times a day (BID) | ORAL | 0 refills | Status: DC | PRN

## 2017-05-03 MED ORDER — OXYCODONE-ACETAMINOPHEN 5-325 MG PO TABS: 2.0000 | ORAL_TABLET | Freq: Once | ORAL | Status: DC

## 2017-05-03 MED ORDER — OXYCODONE-ACETAMINOPHEN 5-325 MG PO TABS
2.0000 | ORAL_TABLET | Freq: Once | ORAL | Status: AC
  Administered 2017-05-03: 2 via ORAL
  Filled 2017-05-03: qty 2
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
## 2017-05-03 NOTE — ED Notes (Signed)
Patient Alert and oriented X4. Stable and ambulatory. Patient verbalized understanding of the discharge instructions.  Patient belongings were taken by the patient.  

## 2017-05-03 NOTE — Progress Notes (Signed)
Orthopedic Tech Progress Note Patient Details:  Abigail Hughes 04-01-1986 [id number]  Ortho Devices Type of Ortho Device: Ace wrap, Arm sling, Sugartong splint Ortho Device/Splint Location: RUE Ortho Device/Splint Interventions: Ordered, Application   Braulio Bosch 05/03/2017, 6:02 PM

## 2017-05-04 ENCOUNTER — Ambulatory Visit (HOSPITAL_BASED_OUTPATIENT_CLINIC_OR_DEPARTMENT_OTHER): Payer: BC Managed Care – PPO | Admitting: Anesthesiology

## 2017-05-04 ENCOUNTER — Ambulatory Visit (HOSPITAL_BASED_OUTPATIENT_CLINIC_OR_DEPARTMENT_OTHER)
Admission: RE | Admit: 2017-05-04 | Discharge: 2017-05-04 | Disposition: A | Discharge status: Home / Self Care | Payer: BC Managed Care – PPO | Source: Ambulatory Visit | Attending: Orthopedic Surgery | Admitting: Orthopedic Surgery

## 2017-05-04 ENCOUNTER — Encounter (HOSPITAL_BASED_OUTPATIENT_CLINIC_OR_DEPARTMENT_OTHER): Payer: Self-pay

## 2017-05-04 ENCOUNTER — Encounter (HOSPITAL_BASED_OUTPATIENT_CLINIC_OR_DEPARTMENT_OTHER)
Admission: RE | Disposition: A | Discharge status: Home / Self Care | Payer: Self-pay | Source: Ambulatory Visit | Attending: Orthopedic Surgery

## 2017-05-04 ENCOUNTER — Other Ambulatory Visit: Payer: Self-pay | Admitting: Orthopedic Surgery

## 2017-05-04 DIAGNOSIS — S52301A Unspecified fracture of shaft of right radius, initial encounter for closed fracture: Secondary | ICD-10-CM | POA: Insufficient documentation

## 2017-05-04 DIAGNOSIS — Z8349 Family history of other endocrine, nutritional and metabolic diseases: Secondary | ICD-10-CM | POA: Insufficient documentation

## 2017-05-04 DIAGNOSIS — Z8249 Family history of ischemic heart disease and other diseases of the circulatory system: Secondary | ICD-10-CM | POA: Insufficient documentation

## 2017-05-04 DIAGNOSIS — Z79899 Other long term (current) drug therapy: Secondary | ICD-10-CM | POA: Insufficient documentation

## 2017-05-04 DIAGNOSIS — Z807 Family history of other malignant neoplasms of lymphoid, hematopoietic and related tissues: Secondary | ICD-10-CM | POA: Insufficient documentation

## 2017-05-04 HISTORY — PX: ORIF RADIAL FRACTURE: SHX5113

## 2017-05-04 SURGERY — OPEN REDUCTION INTERNAL FIXATION (ORIF) RADIAL FRACTURE
Anesthesia: General | Site: Arm Lower | Laterality: Right

## 2017-05-04 MED ORDER — PROMETHAZINE HCL 25 MG/ML IJ SOLN: 6.2500 mg | INTRAMUSCULAR | Status: DC | PRN

## 2017-05-04 MED ORDER — MIDAZOLAM HCL 2 MG/2ML IJ SOLN
INTRAMUSCULAR | Status: AC
  Filled 2017-05-04: qty 2

## 2017-05-04 MED ORDER — MIDAZOLAM HCL 2 MG/2ML IJ SOLN
1.0000 mg | INTRAMUSCULAR | Status: DC | PRN
  Administered 2017-05-04: 2 mg via INTRAVENOUS

## 2017-05-04 MED ORDER — HYDROMORPHONE HCL 1 MG/ML IJ SOLN: 0.2500 mg | INTRAMUSCULAR | Status: DC | PRN

## 2017-05-04 MED ORDER — ONDANSETRON HCL 4 MG/2ML IJ SOLN
INTRAMUSCULAR | Status: DC | PRN
  Administered 2017-05-04: 4 mg via INTRAVENOUS

## 2017-05-04 MED ORDER — BUPIVACAINE-EPINEPHRINE (PF) 0.5% -1:200000 IJ SOLN
INTRAMUSCULAR | Status: DC | PRN
  Administered 2017-05-04: 30 mL via PERINEURAL

## 2017-05-04 MED ORDER — CEFAZOLIN SODIUM-DEXTROSE 2-4 GM/100ML-% IV SOLN
2.0000 g | INTRAVENOUS | Status: AC
  Administered 2017-05-04: 2 g via INTRAVENOUS

## 2017-05-04 MED ORDER — LIDOCAINE 2% (20 MG/ML) 5 ML SYRINGE
INTRAMUSCULAR | Status: DC | PRN
  Administered 2017-05-04: 20 mg via INTRAVENOUS

## 2017-05-04 MED ORDER — PROPOFOL 10 MG/ML IV BOLUS
INTRAVENOUS | Status: DC | PRN
  Administered 2017-05-04: 200 mg via INTRAVENOUS

## 2017-05-04 MED ORDER — DEXAMETHASONE SODIUM PHOSPHATE 4 MG/ML IJ SOLN
INTRAMUSCULAR | Status: DC | PRN
  Administered 2017-05-04: 10 mg via INTRAVENOUS

## 2017-05-04 MED ORDER — CEFAZOLIN SODIUM-DEXTROSE 2-4 GM/100ML-% IV SOLN
INTRAVENOUS | Status: AC
  Filled 2017-05-04: qty 100

## 2017-05-04 MED ORDER — FENTANYL CITRATE (PF) 100 MCG/2ML IJ SOLN
50.0000 ug | INTRAMUSCULAR | Status: DC | PRN
  Administered 2017-05-04: 100 ug via INTRAVENOUS

## 2017-05-04 MED ORDER — OXYCODONE-ACETAMINOPHEN 5-325 MG PO TABS: ORAL_TABLET | ORAL | 0 refills | Status: DC

## 2017-05-04 MED ORDER — CHLORHEXIDINE GLUCONATE 4 % EX LIQD: 60.0000 mL | Freq: Once | CUTANEOUS | Status: DC

## 2017-05-04 MED ORDER — FENTANYL CITRATE (PF) 100 MCG/2ML IJ SOLN
INTRAMUSCULAR | Status: AC
  Filled 2017-05-04: qty 2

## 2017-05-04 MED ORDER — SCOPOLAMINE 1 MG/3DAYS TD PT72: 1.0000 | MEDICATED_PATCH | Freq: Once | TRANSDERMAL | Status: DC | PRN

## 2017-05-04 MED ORDER — LACTATED RINGERS IV SOLN
INTRAVENOUS | Status: DC
  Administered 2017-05-04: 13:00:00 via INTRAVENOUS

## 2017-05-04 MED ORDER — LACTATED RINGERS IV SOLN: 500.0000 mL | INTRAVENOUS | Status: DC

## 2017-05-04 SURGICAL SUPPLY — 59 items
BANDAGE ACE 3X5.8 VEL STRL LF (GAUZE/BANDAGES/DRESSINGS) ×2 IMPLANT
BENZOIN TINCTURE PRP APPL 2/3 (GAUZE/BANDAGES/DRESSINGS) ×2 IMPLANT
BIT DRILL 2.8 QR W/DEPTH MARKS (BIT) ×2 IMPLANT
BLADE MINI RND TIP GREEN BEAV (BLADE) IMPLANT
BLADE SURG 15 STRL LF DISP TIS (BLADE) ×2 IMPLANT
BLADE SURG 15 STRL SS (BLADE) ×2
BNDG ELASTIC 2X5.8 VLCR STR LF (GAUZE/BANDAGES/DRESSINGS) ×2 IMPLANT
BNDG ESMARK 4X9 LF (GAUZE/BANDAGES/DRESSINGS) ×2 IMPLANT
BNDG GAUZE ELAST 4 BULKY (GAUZE/BANDAGES/DRESSINGS) ×2 IMPLANT
CHLORAPREP W/TINT 26ML (MISCELLANEOUS) ×2 IMPLANT
CORD BIPOLAR FORCEPS 12FT (ELECTRODE) ×2 IMPLANT
COVER BACK TABLE 60X90IN (DRAPES) ×2 IMPLANT
COVER MAYO STAND STRL (DRAPES) ×2 IMPLANT
CUFF TOURNIQUET SINGLE 18IN (TOURNIQUET CUFF) ×2 IMPLANT
DRAPE EXTREMITY T 121X128X90 (DRAPE) ×2 IMPLANT
DRAPE OEC MINIVIEW 54X84 (DRAPES) ×2 IMPLANT
DRAPE SURG 17X23 STRL (DRAPES) ×2 IMPLANT
GAUZE SPONGE 4X4 12PLY STRL (GAUZE/BANDAGES/DRESSINGS) ×2 IMPLANT
GAUZE XEROFORM 1X8 LF (GAUZE/BANDAGES/DRESSINGS) ×2 IMPLANT
GLOVE BIO SURGEON STRL SZ7.5 (GLOVE) ×2 IMPLANT
GLOVE BIOGEL PI IND STRL 8 (GLOVE) ×1 IMPLANT
GLOVE BIOGEL PI INDICATOR 8 (GLOVE) ×1
GOWN STRL REUS W/ TWL LRG LVL3 (GOWN DISPOSABLE) ×1 IMPLANT
GOWN STRL REUS W/TWL LRG LVL3 (GOWN DISPOSABLE) ×1
GOWN STRL REUS W/TWL XL LVL3 (GOWN DISPOSABLE) ×2 IMPLANT
GUIDEWIRE ORTHO MINI ACTK .045 (WIRE) ×4 IMPLANT
NEEDLE HYPO 22GX1.5 SAFETY (NEEDLE) IMPLANT
NEEDLE HYPO 25X1 1.5 SAFETY (NEEDLE) IMPLANT
NS IRRIG 1000ML POUR BTL (IV SOLUTION) ×2 IMPLANT
PACK BASIN DAY SURGERY FS (CUSTOM PROCEDURE TRAY) ×2 IMPLANT
PAD CAST 3X4 CTTN HI CHSV (CAST SUPPLIES) ×1 IMPLANT
PAD CAST 4YDX4 CTTN HI CHSV (CAST SUPPLIES) IMPLANT
PADDING CAST ABS 4INX4YD NS (CAST SUPPLIES) ×1
PADDING CAST ABS COTTON 4X4 ST (CAST SUPPLIES) ×1 IMPLANT
PADDING CAST COTTON 3X4 STRL (CAST SUPPLIES) ×1
PADDING CAST COTTON 4X4 STRL (CAST SUPPLIES)
PLATE VOLAR RADIS MIDSHAFT 6 H (Plate) ×2 IMPLANT
SCREW ACTK 2 NL HEX 3.5.11 (Screw) ×4 IMPLANT
SCREW NON LOCK 3.5X10MM (Screw) ×2 IMPLANT
SCREW NONLOCK HEX 3.5X12 (Screw) ×6 IMPLANT
SLEEVE SCD COMPRESS KNEE MED (MISCELLANEOUS) ×2 IMPLANT
SLING ARM FOAM STRAP MED (SOFTGOODS) ×2 IMPLANT
SPLINT PLASTER CAST XFAST 4X15 (CAST SUPPLIES) ×20 IMPLANT
SPLINT PLASTER XTRA FAST SET 4 (CAST SUPPLIES) ×20
STOCKINETTE 4X48 STRL (DRAPES) ×2 IMPLANT
STRIP CLOSURE SKIN 1/2X4 (GAUZE/BANDAGES/DRESSINGS) ×2 IMPLANT
SUCTION FRAZIER HANDLE 10FR (MISCELLANEOUS)
SUCTION TUBE FRAZIER 10FR DISP (MISCELLANEOUS) IMPLANT
SUT ETHILON 3 0 PS 1 (SUTURE) IMPLANT
SUT ETHILON 4 0 PS 2 18 (SUTURE) ×2 IMPLANT
SUT MNCRL AB 4-0 PS2 18 (SUTURE) ×2 IMPLANT
SUT VIC AB 3-0 PS1 18 (SUTURE)
SUT VIC AB 3-0 PS1 18XBRD (SUTURE) IMPLANT
SUT VICRYL 4-0 PS2 18IN ABS (SUTURE) ×2 IMPLANT
SYR BULB 3OZ (MISCELLANEOUS) ×2 IMPLANT
SYR CONTROL 10ML LL (SYRINGE) IMPLANT
TOWEL OR 17X24 6PK STRL BLUE (TOWEL DISPOSABLE) ×4 IMPLANT
TUBE CONNECTING 20X1/4 (TUBING) IMPLANT
UNDERPAD 30X30 (UNDERPADS AND DIAPERS) ×2 IMPLANT

## 2017-05-04 NOTE — Transfer of Care (Signed)
Immediate Anesthesia Transfer of Care Note  Patient: Abigail Hughes  Procedure(s) Performed: Procedure(s) with comments: OPEN REDUCTION INTERNAL FIXATION (ORIF) RADIAL FRACTURE (Right) - Right radial shaft   Patient Location: PACU  Anesthesia Type:General  Level of Consciousness: awake and sedated  Airway & Oxygen Therapy: Patient Spontanous Breathing and Patient connected to face mask oxygen  Post-op Assessment: Report given to RN and Post -op Vital signs reviewed and stable  Post vital signs: Reviewed and stable  Last Vitals:  Vitals:   05/04/17 1300 05/04/17 1305  BP: 104/65 97/69  Pulse: (!) 56 (!) 56  Resp: 19 14  Temp:      Last Pain:  Vitals:   05/04/17 1230  TempSrc: Oral  PainSc: 6       Patients Stated Pain Goal: 3 (05/69/79 4801)  Complications: No apparent anesthesia complications

## 2017-05-04 NOTE — Anesthesia Procedure Notes (Signed)
Anesthesia Regional Block: Supraclavicular block   Pre-Anesthetic Checklist: ,, timeout performed, Correct Patient, Correct Site, Correct Laterality, Correct Procedure, Correct Position, site marked, Risks and benefits discussed,  Surgical consent,  Pre-op evaluation,  At surgeon's request and post-op pain management  Laterality: Right  Prep: chloraprep       Needles:  Injection technique: Single-shot  Needle Type: Echogenic Stimulator Needle     Needle Length: 5cm  Needle Gauge: 22     Additional Needles:   Procedures: ultrasound guided,,,,,,,,  Narrative:  Start time: 05/04/2017 12:54 PM End time: 05/04/2017 1:04 PM Injection made incrementally with aspirations every 5 mL.  Performed by: Personally  Anesthesiologist: Duane Boston  Additional Notes: Functioning IV was confirmed and monitors applied.  A 68mm 22ga echogenic arrow stimulator was used. Sterile prep and drape,hand hygiene and sterile gloves were used.Ultrasound guidance: relevant anatomy identified, needle position confirmed, local anesthetic spread visualized around nerve(s)., vascular puncture avoided.  Image printed for medical record.  Negative aspiration and negative test dose prior to incremental administration of local anesthetic. The patient tolerated the procedure well.

## 2017-05-04 NOTE — H&P (Signed)
Assisted Dr. Singer with right, ultrasound guided, supraclavicular block. Side rails up, monitors on throughout procedure. See vital signs in flow sheet. Tolerated Procedure well. 
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
## 2017-05-04 NOTE — Discharge Instructions (Addendum)
[facility] Instructions Hand Surgery  Wound Care: Keep your hand elevated above the level of your heart.  Do not allow it to dangle by your side.  Keep the dressing dry and do not remove it unless your doctor advises you to do so.  He will usually change it at the time of your post-op visit.  Moving your fingers is advised to stimulate circulation but will depend on the site of your surgery.  If you have a splint applied, your doctor will advise you regarding movement.  Activity: Do not drive or operate machinery today.  Rest today and then you may return to your normal activity and work as indicated by your physician.  Diet:  Drink liquids today or eat a light diet.  You may resume a regular diet tomorrow.    General expectations: Pain for two to three days. Fingers may become slightly swollen.  Call your doctor if any of the following occur: Severe pain not relieved by pain medication. Elevated temperature. Dressing soaked with blood. Inability to move fingers. White or bluish color to fingers.   Do not breast feed while using narcotic pain medication.   Post Anesthesia Home Care Instructions  Activity: Get plenty of rest for the remainder of the day. A responsible individual must stay with you for 24 hours following the procedure.  For the next 24 hours, DO NOT: -Drive a car -Paediatric nurse -Drink alcoholic beverages -Take any medication unless instructed by your physician -Make any legal decisions or sign important papers.  Meals: Start with liquid foods such as gelatin or soup. Progress to regular foods as tolerated. Avoid greasy, spicy, heavy foods. If nausea and/or vomiting occur, drink only clear liquids until the nausea and/or vomiting subsides. Call your physician if vomiting continues.  Special Instructions/Symptoms: Your throat may feel dry or sore from the anesthesia or the breathing tube placed in your throat during surgery. If this causes discomfort, gargle  with warm salt water. The discomfort should disappear within 24 hours.  If you had a scopolamine patch placed behind your ear for the management of post- operative nausea and/or vomiting:  1. The medication in the patch is effective for 72 hours, after which it should be removed.  Wrap patch in a tissue and discard in the trash. Wash hands thoroughly with soap and water. 2. You may remove the patch earlier than 72 hours if you experience unpleasant side effects which may include dry mouth, dizziness or visual disturbances. 3. Avoid touching the patch. Wash your hands with soap and water after contact with the patch.   Regional Anesthesia Blocks  1. Numbness or the inability to move the "blocked" extremity may last from 3-48 hours after placement. The length of time depends on the medication injected and your individual response to the medication. If the numbness is not going away after 48 hours, call your surgeon.  2. The extremity that is blocked will need to be protected until the numbness is gone and the  Strength has returned. Because you cannot feel it, you will need to take extra care to avoid injury. Because it may be weak, you may have difficulty moving it or using it. You may not know what position it is in without looking at it while the block is in effect.  3. For blocks in the legs and feet, returning to weight bearing and walking needs to be done carefully. You will need to wait until the numbness is entirely gone and the strength  has returned. You should be able to move your leg and foot normally before you try and bear weight or walk. You will need someone to be with you when you first try to ensure you do not fall and possibly risk injury.  4. Bruising and tenderness at the needle site are common side effects and will resolve in a few days.  5. Persistent numbness or new problems with movement should be communicated to the surgeon or the [facility] [phone]  [place] [phone]).

## 2017-05-04 NOTE — Op Note (Signed)
I assisted Surgeon(s) and Role:    * Leanora Cover, MD - Primary    Daryll Brod, MD - Assisting on the Procedure(s): OPEN REDUCTION INTERNAL FIXATION (ORIF) RADIAL FRACTURE on 05/04/2017.  I provided assistance on this case as follows: setup. Approach,retraction debridement of the fracture, stabilization of the fracture, fixation of the fracture closure of the wound and application of the dressing and splint. I was present for the entire case.  Electronically signed by: Wynonia Sours, MD Date: 05/04/2017 Time: 2:52 PM

## 2017-05-04 NOTE — Anesthesia Procedure Notes (Signed)
Procedure Name: LMA Insertion Performed by: Amanee Iacovelli W Pre-anesthesia Checklist: Patient identified, Emergency Drugs available, Suction available and Patient being monitored Patient Re-evaluated:Patient Re-evaluated prior to induction Oxygen Delivery Method: Circle system utilized Preoxygenation: Pre-oxygenation with 100% oxygen Induction Type: IV induction Ventilation: Mask ventilation without difficulty LMA: LMA inserted LMA Size: 4.0 Number of attempts: 1 Placement Confirmation: positive ETCO2 Tube secured with: Tape Dental Injury: Teeth and Oropharynx as per pre-operative assessment        

## 2017-05-04 NOTE — Anesthesia Preprocedure Evaluation (Signed)
Anesthesia Evaluation  Patient identified by MRN, date of birth, ID band Patient awake    Reviewed: Allergy & Precautions, NPO status , Patient's Chart, lab work & pertinent test results  History of Anesthesia Complications Negative for: history of anesthetic complications  Airway Mallampati: II  TM Distance: >3 FB Neck ROM: Full    Dental no notable dental hx. (+) Dental Advisory Given   Pulmonary neg pulmonary ROS,    Pulmonary exam normal        Cardiovascular negative cardio ROS Normal cardiovascular exam     Neuro/Psych  Headaches, negative psych ROS   GI/Hepatic negative GI ROS, Neg liver ROS,   Endo/Other  negative endocrine ROS  Renal/GU negative Renal ROS  negative genitourinary   Musculoskeletal negative musculoskeletal ROS (+)   Abdominal   Peds negative pediatric ROS (+)  Hematology negative hematology ROS (+)   Anesthesia Other Findings   Reproductive/Obstetrics negative OB ROS                             Anesthesia Physical Anesthesia Plan  ASA: I  Anesthesia Plan: General   Post-op Pain Management: GA combined w/ Regional for post-op pain   Induction: Intravenous  PONV Risk Score and Plan: 3 and Ondansetron, Dexamethasone and Scopolamine patch - Pre-op  Airway Management Planned: LMA  Additional Equipment:   Intra-op Plan:   Post-operative Plan: Extubation in OR  Informed Consent:   Plan Discussed with:   Anesthesia Plan Comments:         Anesthesia Quick Evaluation

## 2017-05-04 NOTE — Op Note (Signed)
[id number] 

## 2017-05-04 NOTE — Brief Op Note (Signed)
05/04/2017  2:49 PM  PATIENT:  Abigail Hughes  31 y.o. female  PRE-OPERATIVE DIAGNOSIS:  right radial shaft fracture  POST-OPERATIVE DIAGNOSIS:  right radial shaft fracture  PROCEDURE:  Procedure(s) with comments: OPEN REDUCTION INTERNAL FIXATION (ORIF) RADIAL FRACTURE (Right) - Right radial shaft   SURGEON:  Surgeon(s) and Role:    * Leanora Cover, MD - Primary    * Daryll Brod, MD - Assisting  PHYSICIAN ASSISTANT:   ASSISTANTS: Daryll Brod, MD   ANESTHESIA:   regional and general  EBL:  Total I/O In: 1200 [I.V.:1200] Out: 5 [Blood:5]  BLOOD ADMINISTERED:none  DRAINS: none   LOCAL MEDICATIONS USED:  NONE  SPECIMEN:  No Specimen  DISPOSITION OF SPECIMEN:  N/A  COUNTS:  YES  TOURNIQUET:   Total Tourniquet Time Documented: Upper Arm (Right) - 42 minutes Total: Upper Arm (Right) - 42 minutes   DICTATION: .Other Dictation: Dictation Number [id number]  PLAN OF CARE: Discharge to home after PACU  PATIENT DISPOSITION:  PACU - hemodynamically stable.
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
## 2017-05-07 NOTE — Anesthesia Postprocedure Evaluation (Signed)
Anesthesia Post Note  Patient: Abigail Hughes  Procedure(s) Performed: Procedure(s) (LRB): OPEN REDUCTION INTERNAL FIXATION (ORIF) RADIAL FRACTURE (Right)     Patient location during evaluation: PACU Anesthesia Type: General Level of consciousness: sedated Pain management: pain level controlled Vital Signs Assessment: post-procedure vital signs reviewed and stable Respiratory status: spontaneous breathing and respiratory function stable Cardiovascular status: stable Anesthetic complications: no    Last Vitals:  Vitals:   05/04/17 1500 05/04/17 1542  BP: 103/81 (!) 89/56  Pulse: (!) 56 61  Resp: 15 16  Temp:  36.6 C    Last Pain:  Vitals:   05/04/17 1542  TempSrc: Oral  PainSc: 0-No pain   Pain Goal: Patients Stated Pain Goal: 3 (05/04/17 1230)               [place]

## 2017-05-08 ENCOUNTER — Encounter (HOSPITAL_BASED_OUTPATIENT_CLINIC_OR_DEPARTMENT_OTHER): Payer: Self-pay | Admitting: Orthopedic Surgery

## 2017-08-17 ENCOUNTER — Ambulatory Visit: Payer: BC Managed Care – PPO | Admitting: Certified Nurse Midwife

## 2017-10-04 ENCOUNTER — Ambulatory Visit: Payer: BC Managed Care – PPO | Admitting: Certified Nurse Midwife

## 2017-10-04 NOTE — Progress Notes (Deleted)
31 y.o. G1P0000 Married  {Race/ethnicity:17218} Fe here for annual exam.    No LMP recorded.          Sexually active: {yes no:314532}  The current method of family planning is {contraception:315051}.    Exercising: {yes no:314532}  {types:19826} Smoker:  {YES NO:22349}  Health Maintenance: Pap:  10-29-13 neg History of Abnormal Pap: {YES NO:22349} MMG:  none Self Breast exams: {YES NO:22349} Colonoscopy:  none BMD:   none TDaP:  2017 Shingles: no Pneumonia: no Hep C and HIV: *** Labs: ***   reports that  has never smoked. she has never used smokeless tobacco. She reports that she drinks about 0.6 oz of alcohol per week. She reports that she does not use drugs.  Past Medical History:  Diagnosis Date  . High blood cholesterol level    2012 lab results, tested again with normal levels  . Migraines    no aura    Past Surgical History:  Procedure Laterality Date  . ORIF RADIAL FRACTURE Right 05/04/2017   Procedure: OPEN REDUCTION INTERNAL FIXATION (ORIF) RADIAL FRACTURE;  Surgeon: Leanora Cover, MD;  Location: [place];  Service: Orthopedics;  Laterality: Right;  Right radial shaft     Current Outpatient Medications  Medication Sig Dispense Refill  . cyclobenzaprine (FLEXERIL) 10 MG tablet Take 1 tablet (10 mg total) by mouth 2 (two) times daily as needed for muscle spasms. 20 tablet 0  . ibuprofen (ADVIL,MOTRIN) 800 MG tablet Take 1 tablet (800 mg total) by mouth 3 (three) times daily. 21 tablet 0  . oxyCODONE-acetaminophen (PERCOCET) 5-325 MG tablet 1-2 tabs PO q6 hours prn pain 30 tablet 0  . oxyCODONE-acetaminophen (PERCOCET/ROXICET) 5-325 MG tablet Take 1-2 tablets by mouth every 6 (six) hours as needed for severe pain. 15 tablet 0  . Prenatal Vit-Fe Fumarate-FA (PRENATAL MULTIVITAMIN) TABS tablet Take 1 tablet by mouth daily at 12 noon.     No current facility-administered medications for this visit.     Family History  Problem Relation Age of Onset   . Lymphoma Father   . Hyperlipidemia Father   . Hypertension Maternal Grandmother     ROS:  Pertinent items are noted in HPI.  Otherwise, a comprehensive ROS was negative.  Exam:   There were no vitals taken for this visit.   Ht Readings from Last 3 Encounters:  05/04/17 5\' 3"  (1.6 m)  05/03/17 5\' 3"  (1.6 m)  08/20/15 5' 3.75" (1.619 m)    General appearance: alert, cooperative and appears stated age Head: Normocephalic, without obvious abnormality, atraumatic Neck: no adenopathy, supple, symmetrical, trachea midline and thyroid {EXAM; THYROID:18604} Lungs: clear to auscultation bilaterally Breasts: {Exam; breast:13139::"normal appearance, no masses or tenderness"} Heart: regular rate and rhythm Abdomen: soft, non-tender; no masses,  no organomegaly Extremities: extremities normal, atraumatic, no cyanosis or edema Skin: Skin color, texture, turgor normal. No rashes or lesions Lymph nodes: Cervical, supraclavicular, and axillary nodes normal. No abnormal inguinal nodes palpated Neurologic: Grossly normal   Pelvic: External genitalia:  no lesions              Urethra:  normal appearing urethra with no masses, tenderness or lesions              Bartholin's and Skene's: normal                 Vagina: normal appearing vagina with normal color and discharge, no lesions  Cervix: {exam; cervix:14595}              Pap taken: {yes no:314532} Bimanual Exam:  Uterus:  {exam; uterus:12215}              Adnexa: {exam; adnexa:12223}               Rectovaginal: Confirms               Anus:  normal sphincter tone, no lesions  Chaperone present: ***  A:  Well Woman with normal exam  P:   Reviewed health and wellness pertinent to exam  Pap smear: {YES NO:22349}  {plan; gyn:5269::"mammogram","pap smear","return annually or prn"}  An After Visit Summary was printed and given to the patient.

## 2017-11-15 ENCOUNTER — Other Ambulatory Visit (HOSPITAL_COMMUNITY)
Admission: RE | Admit: 2017-11-15 | Discharge: 2017-11-15 | Disposition: A | Discharge status: Home / Self Care | Payer: 59 | Source: Ambulatory Visit | Attending: Obstetrics & Gynecology | Admitting: Obstetrics & Gynecology

## 2017-11-15 ENCOUNTER — Other Ambulatory Visit (HOSPITAL_COMMUNITY)
Admission: RE | Admit: 2017-11-15 | Payer: BC Managed Care – PPO | Source: Ambulatory Visit | Admitting: Obstetrics & Gynecology

## 2017-11-15 ENCOUNTER — Encounter: Payer: Self-pay | Admitting: Certified Nurse Midwife

## 2017-11-15 ENCOUNTER — Ambulatory Visit (INDEPENDENT_AMBULATORY_CARE_PROVIDER_SITE_OTHER): Payer: 59 | Admitting: Certified Nurse Midwife

## 2017-11-15 ENCOUNTER — Other Ambulatory Visit: Payer: Self-pay

## 2017-11-15 VITALS — BP 100/70 | HR 64 | Resp 16 | Ht 64.25 in | Wt 155.0 lb

## 2017-11-15 DIAGNOSIS — Z124 Encounter for screening for malignant neoplasm of cervix: Secondary | ICD-10-CM | POA: Insufficient documentation

## 2017-11-15 DIAGNOSIS — Z01419 Encounter for gynecological examination (general) (routine) without abnormal findings: Secondary | ICD-10-CM | POA: Diagnosis not present

## 2017-11-15 NOTE — Patient Instructions (Signed)

## 2017-11-15 NOTE — Progress Notes (Signed)
32 y.o. G34P0000 Married  Caucasian Fe here for annual exam. Periods normal no issues. Trying to conceive again. Has just decreased nursing in the past 2-3 months and plans to wean.. Planning pregnancy again.Had last baby with [facility] Interested in another CNM birth, there again. Taking prenatal vitamins. Migraines no issues now. No health issues today.  LMP: 10/27/2017 normal         Sexually active: Yes.    The current method of family planning is none.    Exercising: Yes.    yoga & walking Smoker:  no  Health Maintenance: Pap:  10-29-13 neg History of Abnormal Pap: no MMG:  none Self Breast exams: no Colonoscopy:  none BMD:   none TDaP:  2017 Shingles: no Pneumonia: no Hep C and HIV: unsure Labs: no   reports that  has never smoked. she has never used smokeless tobacco. She reports that she drinks about 0.6 oz of alcohol per week. She reports that she does not use drugs.  Past Medical History:  Diagnosis Date  . High blood cholesterol level    2012 lab results, tested again with normal levels  . Migraines    no aura    Past Surgical History:  Procedure Laterality Date  . ORIF RADIAL FRACTURE Right 05/04/2017   Procedure: OPEN REDUCTION INTERNAL FIXATION (ORIF) RADIAL FRACTURE;  Surgeon: Leanora Cover, MD;  Location: [place];  Service: Orthopedics;  Laterality: Right;  Right radial shaft     Current Outpatient Medications  Medication Sig Dispense Refill  . cyclobenzaprine (FLEXERIL) 10 MG tablet Take 1 tablet (10 mg total) by mouth 2 (two) times daily as needed for muscle spasms. 20 tablet 0  . ibuprofen (ADVIL,MOTRIN) 800 MG tablet Take 1 tablet (800 mg total) by mouth 3 (three) times daily. 21 tablet 0  . oxyCODONE-acetaminophen (PERCOCET) 5-325 MG tablet 1-2 tabs PO q6 hours prn pain 30 tablet 0  . oxyCODONE-acetaminophen (PERCOCET/ROXICET) 5-325 MG tablet Take 1-2 tablets by mouth every 6 (six) hours as needed for severe pain. 15  tablet 0  . Prenatal Vit-Fe Fumarate-FA (PRENATAL MULTIVITAMIN) TABS tablet Take 1 tablet by mouth daily at 12 noon.     No current facility-administered medications for this visit.     Family History  Problem Relation Age of Onset  . Lymphoma Father   . Hyperlipidemia Father   . Hypertension Maternal Grandmother     ROS:  Pertinent items are noted in HPI.  Otherwise, a comprehensive ROS was negative.  Exam:   There were no vitals taken for this visit.   Ht Readings from Last 3 Encounters:  05/04/17 5\' 3"  (1.6 m)  05/03/17 5\' 3"  (1.6 m)  08/20/15 5' 3.75" (1.619 m)    General appearance: alert, cooperative and appears stated age Head: Normocephalic, without obvious abnormality, atraumatic Neck: no adenopathy, supple, symmetrical, trachea midline and thyroid normal to inspection and palpation Lungs: clear to auscultation bilaterally Breasts: normal appearance, no masses or tenderness, No nipple retraction or dimpling, No nipple discharge or bleeding, No axillary or supraclavicular adenopathy Heart: regular rate and rhythm Abdomen: soft, non-tender; no masses,  no organomegaly Extremities: extremities normal, atraumatic, no cyanosis or edema Skin: Skin color, texture, turgor normal. No rashes or lesions Lymph nodes: Cervical, supraclavicular, and axillary nodes normal. No abnormal inguinal nodes palpated Neurologic: Grossly normal   Pelvic: External genitalia:  no lesions              Urethra:  normal  appearing urethra with no masses, tenderness or lesions              Bartholin's and Skene's: normal                 Vagina: normal appearing vagina with normal color and discharge, no lesions              Cervix: no bleeding following Pap, no cervical motion tenderness and no lesions              Pap taken: Yes.   Bimanual Exam:  Uterus:  normal size, contour, position, consistency, mobility, non-tender              Adnexa: normal adnexa and no mass, fullness, tenderness                Rectovaginal: Confirms               Anus:  normal sphincter tone, no lesions  Chaperone present: yes  A:  Well Woman with normal exam  Contraception Lactating/NFP, planning pregnancy in the next few months  P:   Reviewed health and wellness pertinent to exam  Discussed importance of good nutrition when nursing and trying for pregnancy. Stressed daily Prenatal vitamins.Questions addressed. Will come in when + UPT at home for confirmation or if no pregnancy in next 6 months.  Pap smear: yes   counseled on breast self exam, adequate intake of calcium and vitamin D, diet and exercise  return annually or prn  An After Visit Summary was printed and given to the patient.

## 2017-11-15 NOTE — Addendum Note (Signed)
Addended by: Regina Eck on: 11/15/2017 02:35 PM   Modules accepted: Orders

## 2017-11-19 LAB — CYTOLOGY - PAP
DIAGNOSIS: NEGATIVE
HPV: NOT DETECTED

## 2018-01-21 ENCOUNTER — Other Ambulatory Visit: Payer: Self-pay | Admitting: Otolaryngology

## 2018-01-21 DIAGNOSIS — R1314 Dysphagia, pharyngoesophageal phase: Secondary | ICD-10-CM | POA: Insufficient documentation

## 2018-01-24 ENCOUNTER — Other Ambulatory Visit: Payer: Self-pay | Admitting: Otolaryngology

## 2018-01-24 ENCOUNTER — Ambulatory Visit
Admission: RE | Admit: 2018-01-24 | Discharge: 2018-01-24 | Disposition: A | Discharge status: Home / Self Care | Payer: 59 | Source: Ambulatory Visit | Attending: Otolaryngology | Admitting: Otolaryngology

## 2018-01-24 DIAGNOSIS — R1314 Dysphagia, pharyngoesophageal phase: Secondary | ICD-10-CM

## 2018-01-28 ENCOUNTER — Ambulatory Visit
Admission: RE | Admit: 2018-01-28 | Discharge: 2018-01-28 | Disposition: A | Discharge status: Home / Self Care | Payer: 59 | Source: Ambulatory Visit | Attending: Otolaryngology | Admitting: Otolaryngology

## 2018-01-28 DIAGNOSIS — R1314 Dysphagia, pharyngoesophageal phase: Secondary | ICD-10-CM

## 2018-02-05 DIAGNOSIS — K449 Diaphragmatic hernia without obstruction or gangrene: Secondary | ICD-10-CM | POA: Insufficient documentation

## 2018-02-05 DIAGNOSIS — K219 Gastro-esophageal reflux disease without esophagitis: Secondary | ICD-10-CM | POA: Insufficient documentation

## 2018-11-26 ENCOUNTER — Ambulatory Visit: Payer: 59 | Admitting: Certified Nurse Midwife

## 2018-11-28 ENCOUNTER — Other Ambulatory Visit (HOSPITAL_COMMUNITY)
Admission: RE | Admit: 2018-11-28 | Discharge: 2018-11-28 | Disposition: A | Discharge status: Home / Self Care | Payer: 59 | Source: Ambulatory Visit | Attending: Certified Nurse Midwife | Admitting: Certified Nurse Midwife

## 2018-11-28 ENCOUNTER — Other Ambulatory Visit: Payer: Self-pay

## 2018-11-28 ENCOUNTER — Ambulatory Visit: Payer: 59 | Admitting: Certified Nurse Midwife

## 2018-11-28 ENCOUNTER — Encounter: Payer: Self-pay | Admitting: Certified Nurse Midwife

## 2018-11-28 VITALS — BP 110/70 | HR 64 | Resp 16 | Ht 64.25 in | Wt 154.0 lb

## 2018-11-28 DIAGNOSIS — Z01419 Encounter for gynecological examination (general) (routine) without abnormal findings: Secondary | ICD-10-CM

## 2018-11-28 DIAGNOSIS — Z0000 Encounter for general adult medical examination without abnormal findings: Secondary | ICD-10-CM | POA: Diagnosis not present

## 2018-11-28 DIAGNOSIS — Z124 Encounter for screening for malignant neoplasm of cervix: Secondary | ICD-10-CM

## 2018-11-28 DIAGNOSIS — E663 Overweight: Secondary | ICD-10-CM

## 2018-11-28 NOTE — Progress Notes (Signed)
33 y.o. G1P1001Married  Caucasian Fe here for annual exam. Periods normal, no issues. Contraception none, trying for pregnancy now. Has been trying to track ovulation, but busy with [age] year old.Has not started prenatal vitamins yet.  Would like to have screening labs today. No health issues in past year or today.   Patient's last menstrual period was 11/25/2018 (exact date).          Sexually active: Yes.    The current method of family planning is none.    Exercising: Yes.    walking & yoga Smoker:  no  Review of Systems  Constitutional: Negative.   HENT: Negative.   Eyes: Negative.   Respiratory: Negative.   Cardiovascular: Negative.   Gastrointestinal: Negative.   Genitourinary: Negative.   Musculoskeletal: Negative.   Skin: Negative.   Neurological: Negative.   Endo/Heme/Allergies: Negative.   Psychiatric/Behavioral: Negative.     Health Maintenance: Pap:  10-29-13 neg, 11-15-17 neg HPV HR neg History of Abnormal Pap: no MMG:  none Self Breast exams: occ Colonoscopy:  none BMD:   none TDaP:  2017 Shingles: no Pneumonia: no Hep C and HIV: maybe during pregnancy Labs: yes   reports that she has never smoked. She has never used smokeless tobacco. She reports current alcohol use of about 2.0 standard drinks of alcohol per week. She reports that she does not use drugs.  Past Medical History:  Diagnosis Date  . High blood cholesterol level    2012 lab results, tested again with normal levels  . Migraines    no aura    Past Surgical History:  Procedure Laterality Date  . ORIF RADIAL FRACTURE Right 05/04/2017   Procedure: OPEN REDUCTION INTERNAL FIXATION (ORIF) RADIAL FRACTURE;  Surgeon: Leanora Cover, MD;  Location: [place];  Service: Orthopedics;  Laterality: Right;  Right radial shaft     No current outpatient medications on file.   No current facility-administered medications for this visit.     Family History  Problem Relation Age of Onset  .  Lymphoma Father   . Hyperlipidemia Father   . Hypertension Maternal Grandmother     ROS:  Pertinent items are noted in HPI.  Otherwise, a comprehensive ROS was negative.  Exam:   BP 110/70   Pulse 64   Resp 16   Ht 5' 4.25" (1.632 m)   Wt 154 lb (69.9 kg)   LMP 11/25/2018 (Exact Date)   BMI 26.23 kg/m  Height: 5' 4.25" (163.2 cm) Ht Readings from Last 3 Encounters:  11/28/18 5' 4.25" (1.632 m)  11/15/17 5' 4.25" (1.632 m)  05/04/17 5\' 3"  (1.6 m)    General appearance: alert, cooperative and appears stated age Head: Normocephalic, without obvious abnormality, atraumatic Neck: no adenopathy, supple, symmetrical, trachea midline and thyroid normal to inspection and palpation Lungs: clear to auscultation bilaterally Breasts: normal appearance, no masses or tenderness, No nipple retraction or dimpling, No nipple discharge or bleeding, No axillary or supraclavicular adenopathy Heart: regular rate and rhythm Abdomen: soft, non-tender; no masses,  no organomegaly Extremities: extremities normal, atraumatic, no cyanosis or edema Skin: Skin color, texture, turgor normal. No rashes or lesions Lymph nodes: Cervical, supraclavicular, and axillary nodes normal. No abnormal inguinal nodes palpated Neurologic: Grossly normal   Pelvic: External genitalia:  no lesions              Urethra:  normal appearing urethra with no masses, tenderness or lesions  Bartholin's and Skene's: normal                 Vagina: normal appearing vagina with normal color and discharge, no lesions              Cervix: multiparous appearance, no cervical motion tenderness, no lesions and period present              Pap taken: Yes.   Bimanual Exam:  Uterus:  normal size, contour, position, consistency, mobility, non-tender and anteverted              Adnexa: normal adnexa and no mass, fullness, tenderness               Rectovaginal: Confirms               Anus:  normal sphincter tone, no  lesions  Chaperone present: yes  A:  Well Woman with normal exam  Contraception none hopeful for pregnancy soon  History of intramural fibroids(2) noted with previous pregnancy.  Screening labs  P:   Reviewed health and wellness pertinent to exam  Discussed ovulation tracking and starting on prenatal vitamins now. Discussed if no pregnancy in the next 6 months to advise, may want to come in and discuss status and possible PUS or sperm count. Patient will consider if needed.  Lab: Lipid panel, CBC, TSH, CMP  Pap smear: yes   counseled on breast self exam, feminine hygiene, adequate intake of calcium and vitamin D, diet and exercise  return annually or prn  An After Visit Summary was printed and given to the patient.

## 2018-11-28 NOTE — Patient Instructions (Signed)

## 2018-11-29 ENCOUNTER — Telehealth: Payer: Self-pay

## 2018-11-29 LAB — COMPREHENSIVE METABOLIC PANEL
ALT: 14 IU/L (ref 0–32)
AST: 15 IU/L (ref 0–40)
Albumin/Globulin Ratio: 2.1 (ref 1.2–2.2)
Albumin: 4.6 g/dL (ref 3.8–4.8)
Alkaline Phosphatase: 110 IU/L (ref 39–117)
BUN/Creatinine Ratio: 22 (ref 9–23)
BUN: 15 mg/dL (ref 6–20)
Bilirubin Total: 0.2 mg/dL (ref 0.0–1.2)
CO2: 23 mmol/L (ref 20–29)
CREATININE: 0.68 mg/dL (ref 0.57–1.00)
Calcium: 9.6 mg/dL (ref 8.7–10.2)
Chloride: 103 mmol/L (ref 96–106)
GFR calc Af Amer: 133 mL/min/{1.73_m2} (ref >59)
GFR calc non Af Amer: 115 mL/min/{1.73_m2} (ref >59)
Globulin, Total: 2.2 g/dL (ref 1.5–4.5)
Glucose: 83 mg/dL (ref 65–99)
Potassium: 4.7 mmol/L (ref 3.5–5.2)
Sodium: 142 mmol/L (ref 134–144)
Total Protein: 6.8 g/dL (ref 6.0–8.5)

## 2018-11-29 LAB — CBC
HEMOGLOBIN: 13.5 g/dL (ref 11.1–15.9)
Hematocrit: 40.5 % (ref 34.0–46.6)
MCH: 29.7 pg (ref 26.6–33.0)
MCHC: 33.3 g/dL (ref 31.5–35.7)
MCV: 89 fL (ref 79–97)
Platelets: 324 10*3/uL (ref 150–450)
RBC: 4.55 x10E6/uL (ref 3.77–5.28)
RDW: 12.9 % (ref 11.7–15.4)
WBC: 6.2 10*3/uL (ref 3.4–10.8)

## 2018-11-29 LAB — LIPID PANEL
CHOLESTEROL TOTAL: 230 mg/dL — AB (ref 100–199)
Chol/HDL Ratio: 3.7 ratio (ref 0.0–4.4)
HDL: 62 mg/dL (ref >39)
LDL CALC: 137 mg/dL — AB (ref 0–99)
Triglycerides: 156 mg/dL — ABNORMAL HIGH (ref 0–149)
VLDL Cholesterol Cal: 31 mg/dL (ref 5–40)

## 2018-11-29 LAB — TSH: TSH: 1.05 u[IU]/mL (ref 0.450–4.500)

## 2018-11-29 NOTE — Telephone Encounter (Signed)
Left message for call back.

## 2018-11-29 NOTE — Telephone Encounter (Signed)
-----   Message from Regina Eck, CNM sent at 11/29/2018 12:39 PM EST ----- Notify patient her cholesterol is elevated at 230 normal<199, triglycerides are borderline, normal is ,149  HDL is good at 62, normal is >39, LDL is elevated at 137 < 99 Work on more vegetables, whole grain,lean meat and fish. Work on exercise that is different from daily routine.  Recheck in 6 months if not pregnant Liver, kidney and glucose profile is normal CBC is normal, no anemia TSH is normal

## 2018-12-02 LAB — CYTOLOGY - PAP: Diagnosis: NEGATIVE

## 2018-12-02 NOTE — Telephone Encounter (Signed)
Patient returned call to [name]. °

## 2018-12-03 ENCOUNTER — Other Ambulatory Visit: Payer: Self-pay | Admitting: Certified Nurse Midwife

## 2018-12-03 DIAGNOSIS — R899 Unspecified abnormal finding in specimens from other organs, systems and tissues: Secondary | ICD-10-CM

## 2018-12-03 NOTE — Telephone Encounter (Signed)
Patient returned call to [name]. °

## 2018-12-03 NOTE — Telephone Encounter (Signed)
Patient notified of results as written by provider 

## 2019-01-02 IMAGING — RF DG ESOPHAGUS
8 series · 14 of 24 positions shown · non-contrast
Comparison: None.

CLINICAL DATA: Esophageal dysphagia

EXAM:
ESOPHOGRAM / BARIUM SWALLOW / BARIUM TABLET STUDY
TECHNIQUE: Combined double contrast and single contrast examination performed
using effervescent crystals, thick barium liquid, and thin barium
liquid. The patient was observed with fluoroscopy swallowing a 13 mm
barium sulphate tablet.
FLUOROSCOPY TIME:  Fluoroscopy Time:  1 minutes and 24 seconds
Radiation Exposure Index (if provided by the fluoroscopic device):
53 mGy
Number of Acquired Spot Images: 0

[Series 1: sequence · 2 of 13 frames shown (1 of 7)]
[frame 2/13]
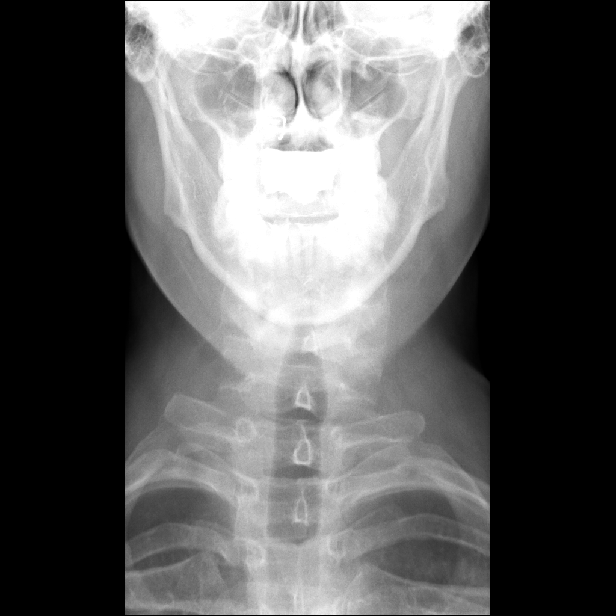
[frame 12/13]
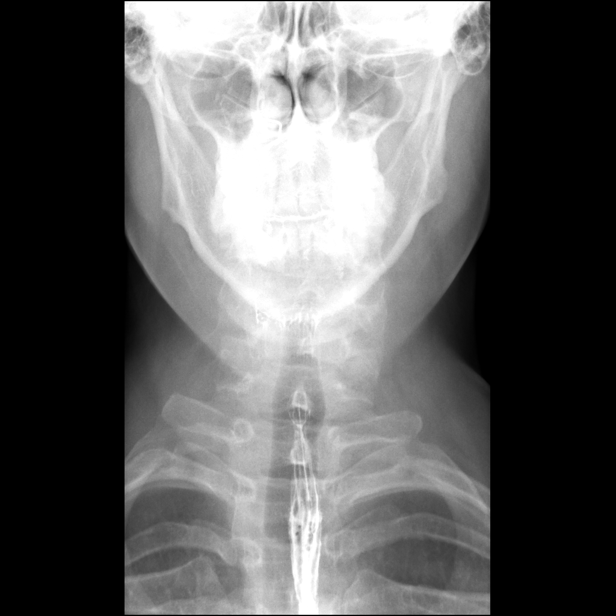

[Series 2: sequence · 1 of 11 frames shown (2 of 7)]
[frame 6/11]
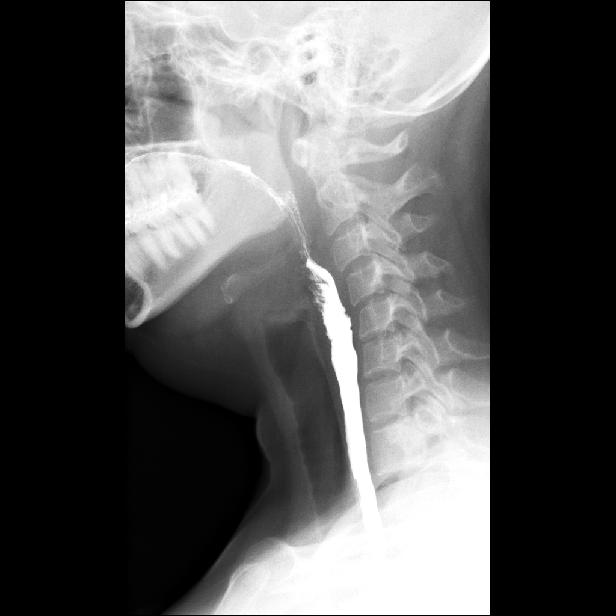

[Series 3: sequence · 2 of 18 frames shown (3 of 7)]
[frame 3/18]
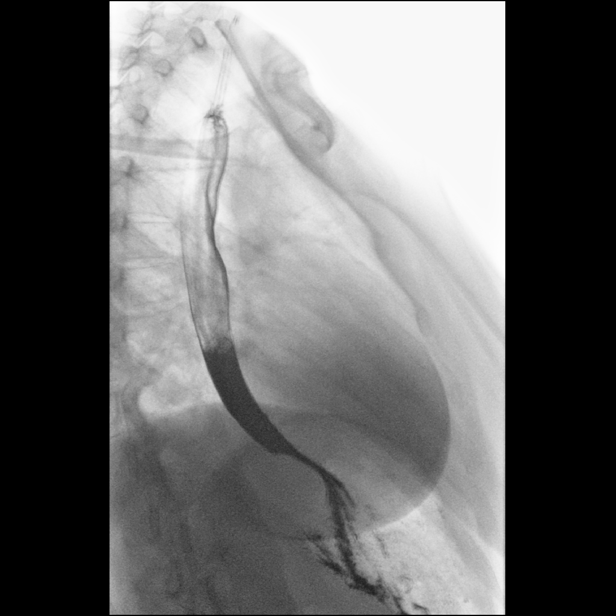
[frame 10/18]
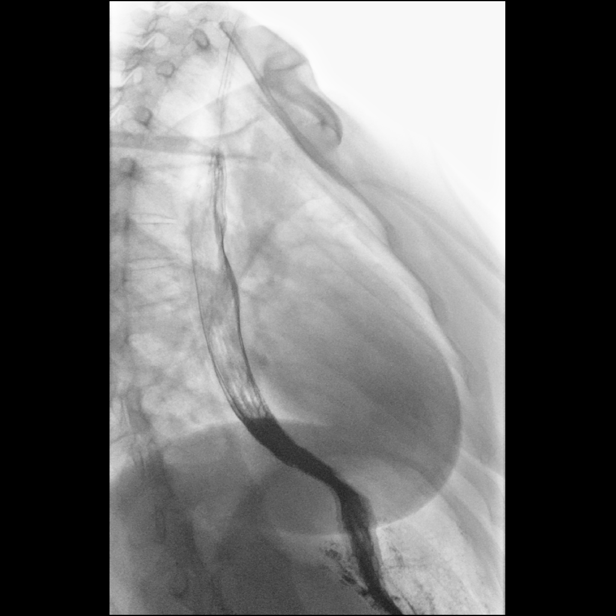

[Series 4: sequence · 3 of 20 frames shown (4 of 7)]
[frame 4/20]
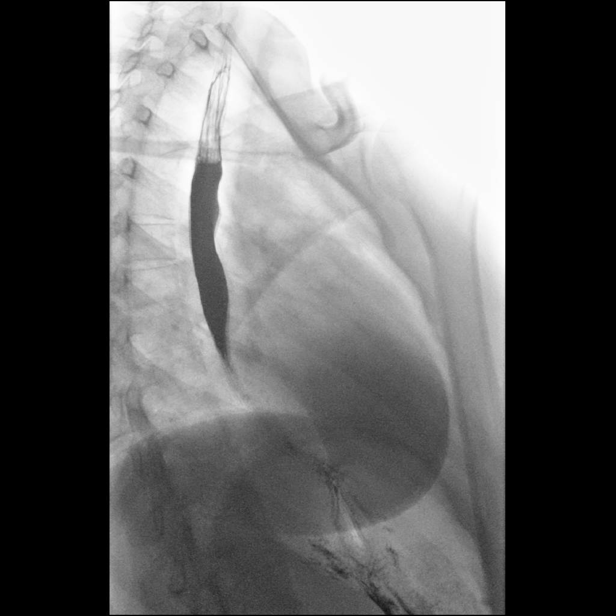
[frame 18/20]
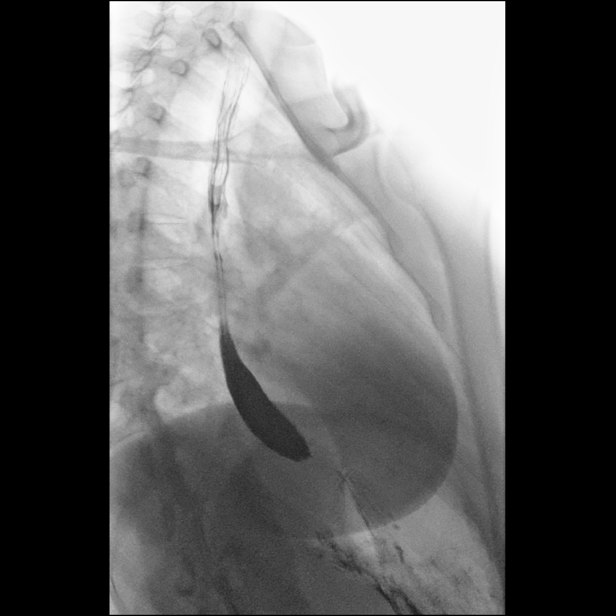
[frame 20/20]
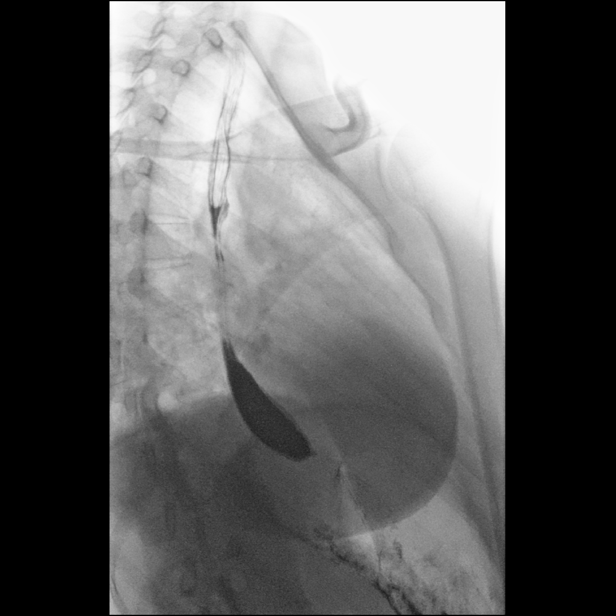

[Series 5: sequence · 2 of 19 frames shown (5 of 7)]
[frame 4/19]
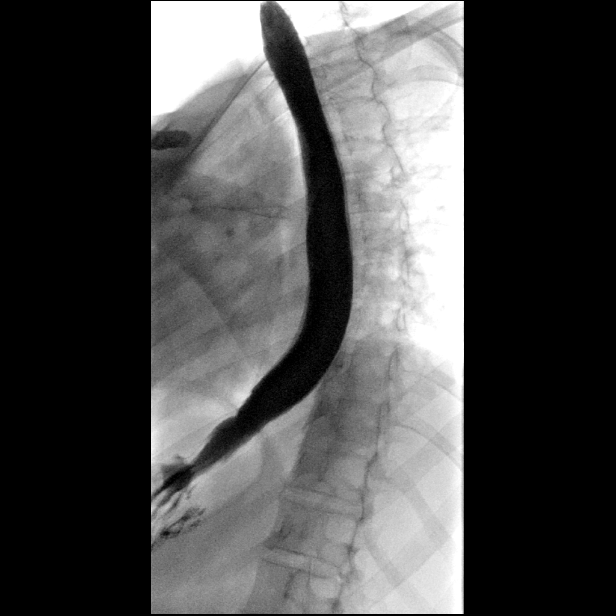
[frame 17/19]
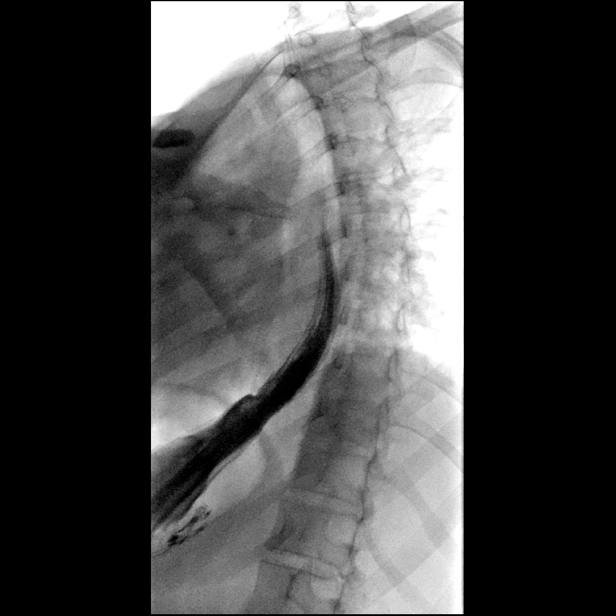

[Series 6: sequence · 2 of 21 frames shown (6 of 7)]
[frame 18/21]
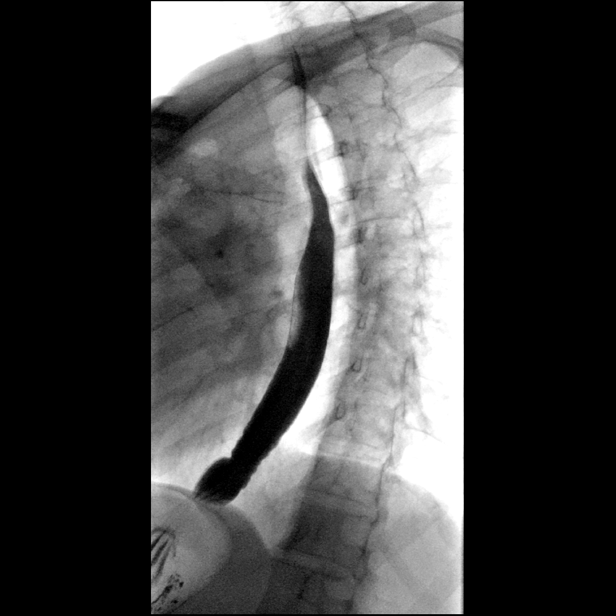
[frame 21/21]
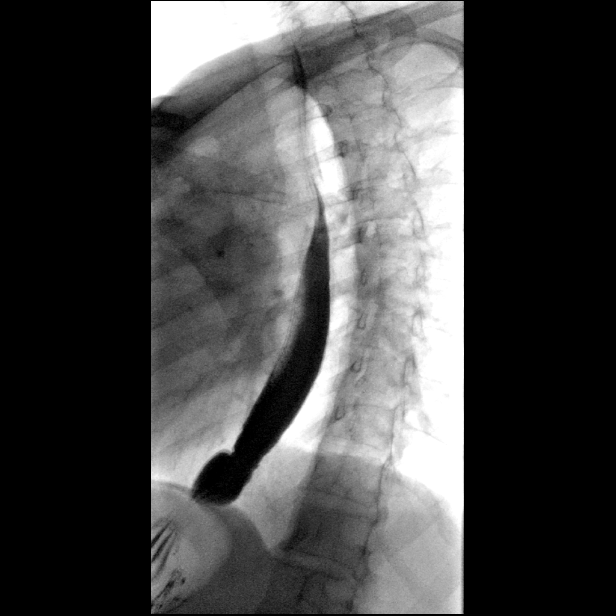

[Series 7: sequence · 1 of 2 frames shown (7 of 7)]
[frame 2/2]
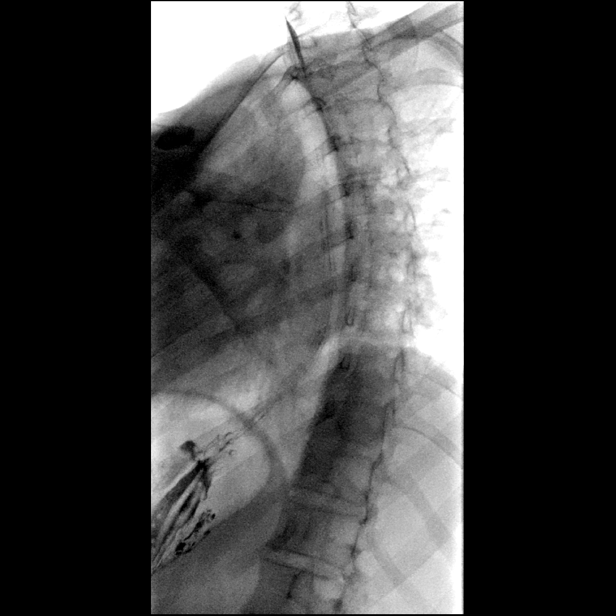

[Series 8: one shot · 1 of 2 slices shown]
[im 2/2]
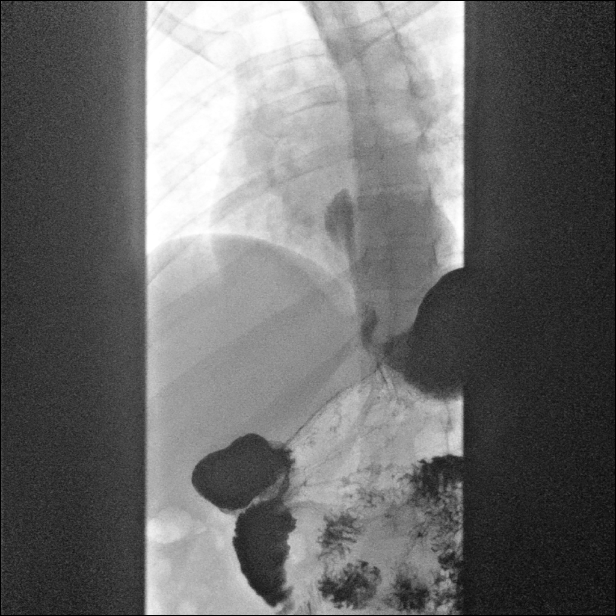

[14 of 24 positions shown; findings below may reference images not displayed]

FINDINGS: Initial barium swallows demonstrate normal pharyngeal motion with
swallowing. No laryngeal penetration or aspiration. No upper
esophageal webs, strictures or diverticuli.

Normal esophageal motility. No intrinsic or extrinsic lesions of the
esophagus were identified.

Small sliding-type hiatal hernia and inducible GE reflux was
demonstrated. There is a widely patent lower esophageal mucosal
ring. The 13 mm barium pill passed into the stomach without
difficulty.
IMPRESSION: 1. Small sliding-type hiatal hernia and inducible GE reflux.
2. Normal esophageal motility.

## 2019-06-09 ENCOUNTER — Other Ambulatory Visit: Payer: 59

## 2019-11-28 ENCOUNTER — Other Ambulatory Visit: Payer: Self-pay

## 2019-12-01 ENCOUNTER — Ambulatory Visit (INDEPENDENT_AMBULATORY_CARE_PROVIDER_SITE_OTHER): Payer: 59 | Admitting: Certified Nurse Midwife

## 2019-12-01 ENCOUNTER — Encounter: Payer: Self-pay | Admitting: Certified Nurse Midwife

## 2019-12-01 ENCOUNTER — Other Ambulatory Visit: Payer: Self-pay

## 2019-12-01 VITALS — BP 114/70 | HR 68 | Temp 98.4°F | Resp 16 | Ht 64.25 in | Wt 164.0 lb

## 2019-12-01 DIAGNOSIS — Z0000 Encounter for general adult medical examination without abnormal findings: Secondary | ICD-10-CM | POA: Diagnosis not present

## 2019-12-01 DIAGNOSIS — N926 Irregular menstruation, unspecified: Secondary | ICD-10-CM

## 2019-12-01 DIAGNOSIS — E559 Vitamin D deficiency, unspecified: Secondary | ICD-10-CM | POA: Diagnosis not present

## 2019-12-01 DIAGNOSIS — Z01419 Encounter for gynecological examination (general) (routine) without abnormal findings: Secondary | ICD-10-CM

## 2019-12-01 NOTE — Patient Instructions (Signed)

## 2019-12-01 NOTE — Progress Notes (Signed)
34 y.o. G91P1000 Married  Caucasian Fe here for annual exam. Periods normal, no issues. Patient has been trying for pregnancy. Cycles are 31-34 days with tracking her mucous secretions. She has not noted significant change in mucous to help her determine when she is ovulating. Has not taken temperature to check for ovulation or used ovulation predictor kit. She had no problems with conceiving first child and would like to be pregnant this year. She has gained 10 pounds over the past year also. Has not started Prenatal vitamins yet. Spouse no health changes. Sees Urgent care if needed. No other health concerns.  Patient's last menstrual period was 11/29/2019 (exact date).          Sexually active: Yes.    The current method of family planning is none.    Exercising: No.  exercise Smoker:  no  Review of Systems  Constitutional: Negative.   HENT: Negative.   Eyes: Negative.   Respiratory: Negative.   Cardiovascular: Negative.   Gastrointestinal: Negative.   Genitourinary: Negative.   Musculoskeletal: Negative.   Skin: Negative.   Neurological: Negative.   Endo/Heme/Allergies: Negative.   Psychiatric/Behavioral: Negative.     Health Maintenance: Pap:  11-15-17 neg HPV HR neg, 11-28-2018 neg History of Abnormal Pap: no MMG:  none Self Breast exams: occ Colonoscopy:  none BMD:   none TDaP:  2017 Shingles: no Pneumonia: no Hep C and HIV: maybe during pregnancy Labs: yes   reports that she has never smoked. She has never used smokeless tobacco. She reports current alcohol use of about 2.0 standard drinks of alcohol per week. She reports that she does not use drugs.  Past Medical History:  Diagnosis Date  . High blood cholesterol level    2012 lab results, tested again with normal levels  . Migraines    no aura    Past Surgical History:  Procedure Laterality Date  . ORIF RADIAL FRACTURE Right 05/04/2017   Procedure: OPEN REDUCTION INTERNAL FIXATION (ORIF) RADIAL FRACTURE;  Surgeon:  Leanora Cover, MD;  Location: [place];  Service: Orthopedics;  Laterality: Right;  Right radial shaft     No current outpatient medications on file.   No current facility-administered medications for this visit.    Family History  Problem Relation Age of Onset  . Lymphoma Father   . Hyperlipidemia Father   . Hypertension Maternal Grandmother     ROS:  Pertinent items are noted in HPI.  Otherwise, a comprehensive ROS was negative.  Exam:   BP 114/70   Pulse 68   Temp 98.4 F (36.9 C) (Skin)   Resp 16   Ht 5' 4.25" (1.632 m)   Wt 164 lb (74.4 kg)   LMP 11/29/2019 (Exact Date)   BMI 27.93 kg/m  Height: 5' 4.25" (163.2 cm) Ht Readings from Last 3 Encounters:  12/01/19 5' 4.25" (1.632 m)  11/28/18 5' 4.25" (1.632 m)  11/15/17 5' 4.25" (1.632 m)    General appearance: alert, cooperative and appears stated age Head: Normocephalic, without obvious abnormality, atraumatic Neck: no adenopathy, supple, symmetrical, trachea midline and thyroid normal to inspection and palpation Lungs: clear to auscultation bilaterally Breasts: normal appearance, no masses or tenderness, No nipple retraction or dimpling, No nipple discharge or bleeding, No axillary or supraclavicular adenopathy Heart: regular rate and rhythm Abdomen: soft, non-tender; no masses,  no organomegaly Extremities: extremities normal, atraumatic, no cyanosis or edema Skin: Skin color, texture, turgor normal. No rashes or lesions Lymph nodes: Cervical, supraclavicular, and axillary  nodes normal. No abnormal inguinal nodes palpated Neurologic: Grossly normal   Pelvic: External genitalia:  no lesions              Urethra:  normal appearing urethra with no masses, tenderness or lesions              Bartholin's and Skene's: normal                 Vagina: normal appearing vagina with normal color and discharge, no lesions              Cervix: multiparous appearance, no lesions and normal appearance               Pap taken: No. Bimanual Exam:  Uterus:  normal size, contour, position, consistency, mobility, non-tender and anteverted              Adnexa: normal adnexa and no mass, fullness, tenderness               Rectovaginal: Confirms               Anus:  normal sphincter tone, no lesions  Chaperone present: yes  A:  Well Woman with normal exam  Contraception none hopeful for pregnancy soon  Weight gain  Screening labs  P:   Reviewed health and wellness pertinent to exam  Discussed starting on prenatal vitamins daily now. Also discussed using BBT graph to assess ovulation with taking temperature before rising from bed. Patient interested in using BBT. Given charts with instructions and how to assess for ovulation. Discussed if no pregnancy in the 2 months to schedule appointment with MD. Discussed weight and thyroid can affect fertility.Will do lab today. Questions addressed. Call if missed period and do a home UPT test and advise.  Labs: TSH, CBC,Lipid panel, Vitamin D  Pap smear: no   counseled on breast self exam, adequate intake of calcium and vitamin D, diet and exercise.  return annually or prn  An After Visit Summary was printed and given to the patient.

## 2019-12-02 ENCOUNTER — Other Ambulatory Visit: Payer: Self-pay | Admitting: Certified Nurse Midwife

## 2019-12-02 ENCOUNTER — Telehealth: Payer: Self-pay | Admitting: Certified Nurse Midwife

## 2019-12-02 DIAGNOSIS — E559 Vitamin D deficiency, unspecified: Secondary | ICD-10-CM

## 2019-12-02 DIAGNOSIS — R6889 Other general symptoms and signs: Secondary | ICD-10-CM

## 2019-12-02 LAB — CBC
Hematocrit: 39.2 % (ref 34.0–46.6)
Hemoglobin: 13.3 g/dL (ref 11.1–15.9)
MCH: 30.7 pg (ref 26.6–33.0)
MCHC: 33.9 g/dL (ref 31.5–35.7)
MCV: 91 fL (ref 79–97)
Platelets: 314 10*3/uL (ref 150–450)
RBC: 4.33 x10E6/uL (ref 3.77–5.28)
RDW: 12.3 % (ref 11.7–15.4)
WBC: 6.6 10*3/uL (ref 3.4–10.8)

## 2019-12-02 LAB — LIPID PANEL
Chol/HDL Ratio: 4 ratio (ref 0.0–4.4)
Cholesterol, Total: 211 mg/dL — ABNORMAL HIGH (ref 100–199)
HDL: 53 mg/dL (ref >39)
LDL Chol Calc (NIH): 117 mg/dL — ABNORMAL HIGH (ref 0–99)
Triglycerides: 235 mg/dL — ABNORMAL HIGH (ref 0–149)
VLDL Cholesterol Cal: 41 mg/dL — ABNORMAL HIGH (ref 5–40)

## 2019-12-02 LAB — VITAMIN D 25 HYDROXY (VIT D DEFICIENCY, FRACTURES): Vit D, 25-Hydroxy: 16.4 ng/mL — ABNORMAL LOW (ref 30.0–100.0)

## 2019-12-02 LAB — TSH: TSH: 1.07 u[IU]/mL (ref 0.450–4.500)

## 2019-12-02 NOTE — Telephone Encounter (Signed)
Call to patient, left detailed message, ok per dpr. Advised as seen below per Melvia Heaps, CNM. Return call to office if any additional questions.   Encounter closed.

## 2019-12-02 NOTE — Telephone Encounter (Signed)
This is acceptable for use. Try on spouse to make sure accurate also.

## 2019-12-02 NOTE — Telephone Encounter (Signed)
Visit Follow-Up Question Received: Today Message Contents  [place], [place] sent to [place]  Phone Number: [phone]  Melton Alar!   I bought an instant read digital thermometer from [place] yesterday and I'm second guessing if that is correct for measuring basal body temp.   This thermometer reads in 2seconds and the directions say to make sure it stays for 5 min.    I just wanted to make sure this was an ok option, unhappy to get another!   Thanks so much,  [employer]

## 2019-12-02 NOTE — Telephone Encounter (Signed)
Routing MyChart message to Melvia Heaps, CNM to review.

## 2019-12-03 LAB — HGB A1C W/O EAG: Hgb A1c MFr Bld: 5.5 % (ref 4.8–5.6)

## 2019-12-03 LAB — SPECIMEN STATUS REPORT

## 2019-12-04 ENCOUNTER — Ambulatory Visit: Payer: 59 | Admitting: Certified Nurse Midwife

## 2019-12-17 ENCOUNTER — Telehealth: Payer: Self-pay | Admitting: *Deleted

## 2019-12-17 NOTE — Telephone Encounter (Signed)
[place] [place] [facility]  Phone Number: [phone]  Hi Debbi!   I would love your opinion on trying to get pregnant and receiving the COVID vaccine.   Thanks so much!  Vedanshi    Routing MyChart message to Melvia Heaps, CNM to review and advise.

## 2019-12-17 NOTE — Telephone Encounter (Signed)
Up to date does not have any specific recommendations, due to the recent vaccine release. CDC website does not address this specifically. Since it is a vaccine, similar to flu vaccine the recommendations there might be helpful.

## 2019-12-17 NOTE — Telephone Encounter (Signed)
See telephone encounter dated 12/17/19.   Encounter closed.  

## 2019-12-18 NOTE — Telephone Encounter (Signed)
Call to patient, left detailed message, ok per dpr. Advised per Melvia Heaps, CNM. Return call to office if any additional questions.   Encounter closed.

## 2019-12-26 ENCOUNTER — Encounter: Payer: Self-pay | Admitting: Certified Nurse Midwife

## 2020-02-06 ENCOUNTER — Other Ambulatory Visit: Payer: Self-pay

## 2020-02-06 NOTE — Progress Notes (Signed)
GYNECOLOGY  VISIT   HPI: 34 y.o.   Married White or Caucasian Not Hispanic or Latino  female   G2P1001 with Patient's last menstrual period was 12/29/2019.   here for   Pregnancy confirmation. Had a son in 2017, has been having unprotected intercourse since 2018. Cycles are q 31-33 days. Ovulating on predictor kits. She has done BBT charts for 2 months, first one was irregular, second one she got pregnant.  She is on PNV.   +UPT 6 days ago, thinks she ovulated around day 16. No bleeding, occasional mild twinge of discomfort, random.   GYNECOLOGIC HISTORY: Patient's last menstrual period was 12/29/2019. Contraception:none Menopausal hormone therapy: none        OB History    Gravida  2   Para  1   Term  1   Preterm  0   AB  0   Living  0     SAB  0   TAB  0   Ectopic  0   Multiple  0   Live Births  1              Patient Active Problem List   Diagnosis Date Noted  . Gastroesophageal reflux disease 02/05/2018  . Hiatal hernia 02/05/2018  . Pharyngoesophageal dysphagia 01/21/2018  . Uterine fibroids affecting pregnancy in second trimester 11/16/2015  . Encounter for supervision of normal first pregnancy in first trimester 10/12/2015  . Hx of migraines 09/21/2015  . Migraines 01/09/2011  . High blood cholesterol level     Past Medical History:  Diagnosis Date  . High blood cholesterol level    2012 lab results, tested again with normal levels  . Migraines    no aura    Past Surgical History:  Procedure Laterality Date  . ORIF RADIAL FRACTURE Right 05/04/2017   Procedure: OPEN REDUCTION INTERNAL FIXATION (ORIF) RADIAL FRACTURE;  Surgeon: Leanora Cover, MD;  Location: [place];  Service: Orthopedics;  Laterality: Right;  Right radial shaft     Current Outpatient Medications  Medication Sig Dispense Refill  . Prenatal Vit-Fe Fumarate-FA (PRENATAL VITAMIN PO) Take by mouth.    Marland Kitchen VITAMIN D PO Take 2,000 Int'l Units by mouth.     No  current facility-administered medications for this visit.     ALLERGIES: Patient has no known allergies.  Family History  Problem Relation Age of Onset  . Lymphoma Father   . Hyperlipidemia Father   . Hypertension Maternal Grandmother     Social History   Socioeconomic History  . Marital status: Married    Spouse name: Sreeja Brandell  . Number of children: Not on file  . Years of education: Masters   . Highest education level: Not on file  Occupational History  . Occupation: Pharmacist, hospital     Comment: [employer]  Tobacco Use  . Smoking status: Never Smoker  . Smokeless tobacco: Never Used  Substance and Sexual Activity  . Alcohol use: Yes    Alcohol/week: 2.0 standard drinks    Types: 2 Standard drinks or equivalent per week  . Drug use: No  . Sexual activity: Yes    Partners: Male    Birth control/protection: None  Other Topics Concern  . Not on file  Social History Narrative  . Not on file   Social Determinants of Health   Financial Resource Strain:   . Difficulty of Paying Living Expenses:   Food Insecurity:   . Worried About Charity fundraiser  in the Last Year:   . [place] in the Last Year:   Transportation Needs:   . Film/video editor (Medical):   Marland Kitchen Lack of Transportation (Non-Medical):   Physical Activity:   . Days of Exercise per Week:   . Minutes of Exercise per Session:   Stress:   . Feeling of Stress :   Social Connections:   . Frequency of Communication with Friends and Family:   . Frequency of Social Gatherings with Friends and Family:   . Attends Religious Services:   . Active Member of Clubs or Organizations:   . Attends Archivist Meetings:   Marland Kitchen Marital Status:   Intimate Partner Violence:   . Fear of Current or Ex-Partner:   . Emotionally Abused:   Marland Kitchen Physically Abused:   . Sexually Abused:     Review of Systems  Constitutional: Negative.   HENT: Negative.   Eyes: Negative.   Respiratory: Negative.    Cardiovascular: Negative.   Gastrointestinal: Negative.   Genitourinary: Negative.   Musculoskeletal: Negative.   Skin: Negative.   Neurological: Negative.   Endo/Heme/Allergies: Negative.   Psychiatric/Behavioral: Negative.     PHYSICAL EXAMINATION:    BP 120/66 (BP Location: Right Arm, Patient Position: Sitting, Cuff Size: Normal)   Pulse 64   Temp 97.7 F (36.5 C) (Skin)   Resp 14   Ht 5\' 4"  (1.626 m)   Wt 161 lb 1.6 oz (73.1 kg)   LMP 12/29/2019   BMI 27.65 kg/m     General appearance: alert, cooperative and appears stated age  ASSESSMENT +UPT, h/o secondary infertility Vit d def, on 2000 IU a day    PLAN [facility] now Will follow [facility] until high enough to do an ultrasound. Information on pregnancy given Ectopic precautions reviewed. Vit d level   An After Visit Summary was printed and given to the patient.  ~20 minutes spent in total patient care.

## 2020-02-09 ENCOUNTER — Encounter: Payer: Self-pay | Admitting: Obstetrics and Gynecology

## 2020-02-09 ENCOUNTER — Ambulatory Visit (INDEPENDENT_AMBULATORY_CARE_PROVIDER_SITE_OTHER): Payer: 59 | Admitting: Obstetrics and Gynecology

## 2020-02-09 ENCOUNTER — Other Ambulatory Visit: Payer: Self-pay

## 2020-02-09 VITALS — BP 120/66 | HR 64 | Temp 97.7°F | Resp 14 | Ht 64.0 in | Wt 161.1 lb

## 2020-02-09 DIAGNOSIS — Z3201 Encounter for pregnancy test, result positive: Secondary | ICD-10-CM

## 2020-02-09 DIAGNOSIS — N926 Irregular menstruation, unspecified: Secondary | ICD-10-CM | POA: Diagnosis not present

## 2020-02-09 DIAGNOSIS — E559 Vitamin D deficiency, unspecified: Secondary | ICD-10-CM

## 2020-02-09 DIAGNOSIS — N979 Female infertility, unspecified: Secondary | ICD-10-CM

## 2020-02-09 LAB — POCT URINE PREGNANCY: Preg Test, Ur: POSITIVE — AB

## 2020-02-09 NOTE — Patient Instructions (Signed)
Commonly Asked Questions During Pregnancy  Cats: A parasite can be excreted in cat feces.  To avoid exposure you need to have another person empty the little box.  If you must empty the litter box you will need to wear gloves.  Wash your hands after handling your cat.  This parasite can also be found in raw or undercooked meat so this should also be avoided.  Colds, Sore Throats, Flu: Please check your medication sheet to see what you can take for symptoms.  If your symptoms are unrelieved by these medications please call the office.  Dental Work: Most any dental work your dentist recommends is permitted.  X-rays should only be taken during the first trimester if absolutely necessary.  Your abdomen should be shielded with a lead apron during all x-rays.  Please notify your provider prior to receiving any x-rays.  Novocaine is fine; gas is not recommended.  If your dentist requires a note from us prior to dental work please call the office and we will provide one for you.  Exercise: Exercise is an important part of staying healthy during your pregnancy.  You may continue most exercises you were accustomed to prior to pregnancy.  Later in your pregnancy you will most likely notice you have difficulty with activities requiring balance like riding a bicycle.  It is important that you listen to your body and avoid activities that put you at a higher risk of falling.  Adequate rest and staying well hydrated are a must!  If you have questions about the safety of specific activities ask your provider.    Exposure to Children with illness: Try to avoid obvious exposure; report any symptoms to us when noted,  If you have chicken pos, red measles or mumps, you should be immune to these diseases.   Please do not take any vaccines while pregnant unless you have checked with your OB provider.  Fetal Movement: After 28 weeks we recommend you do "kick counts" twice daily.  Lie or sit down in a calm quiet environment and  count your baby movements "kicks".  You should feel your baby at least 10 times per hour.  If you have not felt 10 kicks within the first hour get up, walk around and have something sweet to eat or drink then repeat for an additional hour.  If count remains less than 10 per hour notify your provider.  Fumigating: Follow your pest control agent's advice as to how long to stay out of your home.  Ventilate the area well before re-entering.  Hemorrhoids:   Most over-the-counter preparations can be used during pregnancy.  Check your medication to see what is safe to use.  It is important to use a stool softener or fiber in your diet and to drink lots of liquids.  If hemorrhoids seem to be getting worse please call the office.   Hot Tubs:  Hot tubs Jacuzzis and saunas are not recommended while pregnant.  These increase your internal body temperature and should be avoided.  Intercourse:  Sexual intercourse is safe during pregnancy as long as you are comfortable, unless otherwise advised by your provider.  Spotting may occur after intercourse; report any bright red bleeding that is heavier than spotting.  Labor:  If you know that you are in labor, please go to the hospital.  If you are unsure, please call the office and let us help you decide what to do.  Lifting, straining, etc:  If your job requires heavy   lifting or straining please check with your provider for any limitations.  Generally, you should not lift items heavier than that you can lift simply with your hands and arms (no back muscles)  Painting:  Paint fumes do not harm your pregnancy, but may make you ill and should be avoided if possible.  Latex or water based paints have less odor than oils.  Use adequate ventilation while painting.  Permanents & Hair Color:  Chemicals in hair dyes are not recommended as they cause increase hair dryness which can increase hair loss during pregnancy.  " Highlighting" and permanents are allowed.  Dye may be  absorbed differently and permanents may not hold as well during pregnancy.  Sunbathing:  Use a sunscreen, as skin burns easily during pregnancy.  Drink plenty of fluids; avoid over heating.  Tanning Beds:  Because their possible side effects are still unknown, tanning beds are not recommended.  Ultrasound Scans:  Routine ultrasounds are performed at approximately 20 weeks.  You will be able to see your baby's general anatomy an if you would like to know the gender this can usually be determined as well.  If it is questionable when you conceived you may also receive an ultrasound early in your pregnancy for dating purposes.  Otherwise ultrasound exams are not routinely performed unless there is a medical necessity.  Although you can request a scan we ask that you pay for it when conducted because insurance does not cover " patient request" scans.  Work: If your pregnancy proceeds without complications you may work until your due date, unless your physician or employer advises otherwise.  Round Ligament Pain/Pelvic Discomfort:  Sharp, shooting pains not associated with bleeding are fairly common, usually occurring in the second trimester of pregnancy.  They tend to be worse when standing up or when you remain standing for long periods of time.  These are the result of pressure of certain pelvic ligaments called "round ligaments".  Rest, Tylenol and heat seem to be the most effective relief.  As the womb and fetus grow, they rise out of the pelvis and the discomfort improves.  Please notify the office if your pain seems different than that described.  It may represent a more serious condition.  Common Medications Safe in Pregnancy  Acne:      Constipation:  Benzoyl Peroxide     Colace  Clindamycin      Dulcolax Suppository  Topica Erythromycin     Fibercon  Salicylic Acid      Metamucil         Miralax AVOID:        Senakot   Accutane    Cough:  Retin-A       Cough  Drops  Tetracycline      Phenergan w/ Codeine if Rx  Minocycline      Robitussin (Plain & DM)  Antibiotics:     Crabs/Lice:  Ceclor       RID  Cephalosporins    AVOID:  E-Mycins      Kwell  Keflex  Macrobid/Macrodantin   Diarrhea:  Penicillin      Kao-Pectate  Zithromax      Imodium AD         PUSH FLUIDS AVOID:       Cipro     Fever:  Tetracycline      Tylenol (Regular or Extra  Minocycline       Strength)  Levaquin      Extra Strength-Do not  Exceed 8 tabs/24 hrs Caffeine:        <200mg/day (equiv. To 1 cup of coffee or  approx. 3 12 oz sodas)         Gas: Cold/Hayfever:       Gas-X  Benadryl      Mylicon  Claritin       Phazyme  **Claritin-D        Chlor-Trimeton    Headaches:  Dimetapp      ASA-Free Excedrin  Drixoral-Non-Drowsy     Cold Compress  Mucinex (Guaifenasin)     Tylenol (Regular or Extra  Sudafed/Sudafed-12 Hour     Strength)  **Sudafed PE Pseudoephedrine   Tylenol Cold & Sinus     Vicks Vapor Rub  Zyrtec  **AVOID if Problems With Blood Pressure         Heartburn: Avoid lying down for at least 1 hour after meals  Aciphex      Maalox     Rash:  Milk of Magnesia     Benadryl    Mylanta       1% Hydrocortisone Cream  Pepcid  Pepcid Complete   Sleep Aids:  Prevacid      Ambien   Prilosec       Benadryl  Rolaids       Chamomile Tea  Tums (Limit 4/day)     Unisom  Zantac       Tylenol PM         Warm milk-add vanilla or  Hemorrhoids:       Sugar for taste  Anusol/Anusol H.C.  (RX: Analapram 2.5%)  Sugar Substitutes:  Hydrocortisone OTC     Ok in moderation  Preparation H      Tucks        Vaseline lotion applied to tissue with wiping    Herpes:     Throat:  Acyclovir      Oragel  Famvir  Valtrex     Vaccines:         Flu Shot Leg Cramps:       *Gardasil  Benadryl      Hepatitis A         Hepatitis B Nasal Spray:       Pneumovax  Saline Nasal Spray     Polio Booster         Tetanus Nausea:       Tuberculosis test or  PPD  Vitamin B6 25 mg TID   AVOID:    Dramamine      *Gardasil  Emetrol       Live Poliovirus  Ginger Root 250 mg QID    MMR (measles, mumps &  High Complex Carbs @ Bedtime    rebella)  Sea Bands-Accupressure    Varicella (Chickenpox)  Unisom 1/2 tab TID     *No known complications           If received before Pain:         Known pregnancy;   Darvocet       Resume series after  Lortab        Delivery  Percocet    Yeast:   Tramadol      Femstat  Tylenol 3      Gyne-lotrimin  Ultram       Monistat  Vicodin           MISC:         All Sunscreens             Hair Coloring/highlights          Insect Repellant's          (Including DEET)         Mystic Tans  

## 2020-02-10 ENCOUNTER — Telehealth: Payer: Self-pay | Admitting: Obstetrics and Gynecology

## 2020-02-10 ENCOUNTER — Telehealth: Payer: Self-pay

## 2020-02-10 DIAGNOSIS — Z3201 Encounter for pregnancy test, result positive: Secondary | ICD-10-CM

## 2020-02-10 DIAGNOSIS — N926 Irregular menstruation, unspecified: Secondary | ICD-10-CM

## 2020-02-10 DIAGNOSIS — N979 Female infertility, unspecified: Secondary | ICD-10-CM

## 2020-02-10 LAB — BETA HCG QUANT (REF LAB): hCG Quant: 12249 m[IU]/mL

## 2020-02-10 LAB — VITAMIN D 25 HYDROXY (VIT D DEFICIENCY, FRACTURES): Vit D, 25-Hydroxy: 28.9 ng/mL — ABNORMAL LOW (ref 30.0–100.0)

## 2020-02-10 NOTE — Telephone Encounter (Signed)
Spoke with pt. Pt given results and recommendations per Dr Talbert Nan. Pt agreeable. Pt states does take 2000 IU vit D now and will increase to 3000 IUD daily. Pt verbalized understanding.  Pt scheduled for PUS viability on 02/12/20 at 9 am per Dr Talbert Nan. Pt agreeable.   Routing to Dr Talbert Nan for review.  Encounter closed.  Cc: Hayley for precert. Orders placed.

## 2020-02-10 NOTE — Telephone Encounter (Signed)
Left message for pt to return call to triage RN. 

## 2020-02-10 NOTE — Telephone Encounter (Signed)
Call to patient. Per patient, OK to leave message on voicemail.   Left voicemail requesting a return call to [facility] to review benefits for scheduled Pelvic ultrasound with Sumner Boast, MD.

## 2020-02-10 NOTE — Telephone Encounter (Signed)
-----   Message from Salvadore Dom, MD sent at 02/10/2020 11:10 AM EDT ----- Please let the patient know that her [facility] is great and set her up for an ultrasound this week. Her vit d is still a little low and she should increase her vit d from 2,000 IU a day to 3,000 IU a day. Please confirm she is on 2,000 IU a day

## 2020-02-12 ENCOUNTER — Other Ambulatory Visit: Payer: Self-pay

## 2020-02-12 ENCOUNTER — Other Ambulatory Visit: Payer: 59

## 2020-02-12 ENCOUNTER — Ambulatory Visit (INDEPENDENT_AMBULATORY_CARE_PROVIDER_SITE_OTHER): Payer: 59

## 2020-02-12 ENCOUNTER — Ambulatory Visit (INDEPENDENT_AMBULATORY_CARE_PROVIDER_SITE_OTHER): Payer: 59 | Admitting: Obstetrics and Gynecology

## 2020-02-12 VITALS — BP 110/60 | HR 83 | Temp 99.0°F | Ht 64.0 in | Wt 160.6 lb

## 2020-02-12 DIAGNOSIS — Z3491 Encounter for supervision of normal pregnancy, unspecified, first trimester: Secondary | ICD-10-CM

## 2020-02-12 DIAGNOSIS — Z3201 Encounter for pregnancy test, result positive: Secondary | ICD-10-CM

## 2020-02-12 DIAGNOSIS — N979 Female infertility, unspecified: Secondary | ICD-10-CM | POA: Diagnosis not present

## 2020-02-12 DIAGNOSIS — N926 Irregular menstruation, unspecified: Secondary | ICD-10-CM | POA: Diagnosis not present

## 2020-02-12 DIAGNOSIS — N83202 Unspecified ovarian cyst, left side: Secondary | ICD-10-CM | POA: Diagnosis not present

## 2020-02-12 NOTE — Progress Notes (Signed)
GYNECOLOGY  VISIT   HPI: 34 y.o.   Married White or Caucasian Not Hispanic or Latino  female   G2P1001 with Patient's last menstrual period was 12/29/2019.   here for ultrasound consults for viability scan 6w 1day gestation.   GYNECOLOGIC HISTORY: Patient's last menstrual period was 12/29/2019. Contraception:none Menopausal hormone therapy: none        OB History    Gravida  2   Para  1   Term  1   Preterm  0   AB  0   Living  1     SAB  0   TAB  0   Ectopic  0   Multiple  0   Live Births  1        Obstetric Comments  FTNSVD           Patient Active Problem List   Diagnosis Date Noted  . Gastroesophageal reflux disease 02/05/2018  . Hiatal hernia 02/05/2018  . Pharyngoesophageal dysphagia 01/21/2018  . Uterine fibroids affecting pregnancy in second trimester 11/16/2015  . Encounter for supervision of normal first pregnancy in first trimester 10/12/2015  . Hx of migraines 09/21/2015  . Migraines 01/09/2011  . High blood cholesterol level     Past Medical History:  Diagnosis Date  . High blood cholesterol level    2012 lab results, tested again with normal levels  . Migraines    no aura    Past Surgical History:  Procedure Laterality Date  . ORIF RADIAL FRACTURE Right 05/04/2017   Procedure: OPEN REDUCTION INTERNAL FIXATION (ORIF) RADIAL FRACTURE;  Surgeon: Leanora Cover, MD;  Location: [facility];  Service: Orthopedics;  Laterality: Right;  Right radial shaft     Current Outpatient Medications  Medication Sig Dispense Refill  . Prenatal Vit-Fe Fumarate-FA (PRENATAL VITAMIN PO) Take by mouth.    Marland Kitchen VITAMIN D PO Take 2,000 Int'l Units by mouth.     No current facility-administered medications for this visit.     ALLERGIES: Patient has no known allergies.  Family History  Problem Relation Age of Onset  . Lymphoma Father   . Hyperlipidemia Father   . Hypertension Maternal Grandmother     Social History   Socioeconomic  History  . Marital status: Married    Spouse name: Alycia Leuenberger  . Number of children: Not on file  . Years of education: Masters   . Highest education level: Not on file  Occupational History  . Occupation: Pharmacist, hospital     Comment: [employer]  Tobacco Use  . Smoking status: Never Smoker  . Smokeless tobacco: Never Used  Substance and Sexual Activity  . Alcohol use: Yes    Alcohol/week: 2.0 standard drinks    Types: 2 Standard drinks or equivalent per week  . Drug use: No  . Sexual activity: Yes    Partners: Male    Birth control/protection: None  Other Topics Concern  . Not on file  Social History Narrative  . Not on file   Social Determinants of Health   Financial Resource Strain:   . Difficulty of Paying Living Expenses:   Food Insecurity:   . Worried About Charity fundraiser in the Last Year:   . Arboriculturist in the Last Year:   Transportation Needs:   . Film/video editor (Medical):   Marland Kitchen Lack of Transportation (Non-Medical):   Physical Activity:   . Days of Exercise per Week:   . Minutes of Exercise  per Session:   Stress:   . Feeling of Stress :   Social Connections:   . Frequency of Communication with Friends and Family:   . Frequency of Social Gatherings with Friends and Family:   . Attends Religious Services:   . Active Member of Clubs or Organizations:   . Attends Archivist Meetings:   Marland Kitchen Marital Status:   Intimate Partner Violence:   . Fear of Current or Ex-Partner:   . Emotionally Abused:   Marland Kitchen Physically Abused:   . Sexually Abused:     Review of Systems  All other systems reviewed and are negative.   PHYSICAL EXAMINATION:    BP 110/60   Pulse 83   Temp 99 F (37.2 C)   Ht 5\' 4"  (1.626 m)   Wt 160 lb 9.6 oz (72.8 kg)   LMP 12/29/2019   SpO2 99%   BMI 27.57 kg/m     General appearance: alert, cooperative and appears stated age   Ultrasound images reviewed with the patient.  ASSESSMENT Viable IUP Complex  left ovarian cyst, possible endometrioma, not symptomatic Discussed covid vaccination, reviewed up to date and recommendations    PLAN She will establish care with OB She will need f/u imaging of her ovarian cyst Discussed covid vaccination, limited studies, antibodies are passed on to the infant. Discussed that small studies in pregnant women haven't show adverse effects. Vaccination is recommended. I can't find data on when to vaccinate, my thought would be to wait until her second trimester, but she will check with her OB.    In addition to reviewing the ultrasound with the patient, over 10 more minutes was spent discussing covid vaccination.

## 2020-02-13 ENCOUNTER — Encounter: Payer: Self-pay | Admitting: Obstetrics and Gynecology

## 2020-03-01 ENCOUNTER — Other Ambulatory Visit: Payer: 59

## 2020-04-08 DIAGNOSIS — D271 Benign neoplasm of left ovary: Secondary | ICD-10-CM

## 2020-04-08 HISTORY — DX: Benign neoplasm of left ovary: D27.1

## 2020-05-26 ENCOUNTER — Ambulatory Visit: Payer: 59 | Admitting: Cardiology

## 2020-05-26 ENCOUNTER — Encounter: Payer: Self-pay | Admitting: Cardiology

## 2020-05-26 ENCOUNTER — Other Ambulatory Visit: Payer: Self-pay

## 2020-05-26 VITALS — BP 128/84 | HR 95 | Ht 64.0 in | Wt 177.6 lb

## 2020-05-26 DIAGNOSIS — Z3A21 21 weeks gestation of pregnancy: Secondary | ICD-10-CM

## 2020-05-26 DIAGNOSIS — Z7189 Other specified counseling: Secondary | ICD-10-CM

## 2020-05-26 DIAGNOSIS — R002 Palpitations: Secondary | ICD-10-CM

## 2020-05-26 NOTE — Progress Notes (Signed)
Cardiology Office Note:    Date:  05/26/2020   ID:  Abigail Hughes, DOB 1986-07-29, MRN [id number]  PCP:  Patient, No Pcp Per  Cardiologist:  Buford Dresser, MD  Referring MD: Rexene Agent, CNM   CC: new patient consultation for chest fluttering and shortness of breath.  History of Present Illness:    Abigail Hughes is a 34 y.o. female without prior significant cardiac history who is seen as a new consult at the request of Rexene Agent, CNM for the evaluation and management of chest fluttering and shortness of breath.  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 No urgent indication for surgery, planned to re-evaluate post delivery.  Patient concerns: Martin Majestic to an early prenatal visit, felt fluttering in her neck that make her cough or short of breath. Was initially daily, then gradually decreased. Now [redacted] weeks pregnant and happens nearly never.   Discussed both Zio and KardiaMobile. See below.  Cardiovascular risk factors: Prior clinical ASCVD: none Comorbid conditions: Denies hypertension, diabetes, chronic kidney disease. Endorses high cholesterol in the past.  Metabolic syndrome/Obesity: currently pregnant, 21 weeks Chronic inflammatory conditions: none Tobacco use history: never Family history: father had high cholesterol but was otherwise health, no heart issues. No one else with high cholesterol. No MI or CVA. Prior cardiac testing and/or incidental findings on other testing (ie coronary calcium): none Exercise level: chases her toddler around, no limitations Current diet: has improved since the first trimester.   Denies chest pain, shortness of breath at rest or with normal exertion. No PND, orthopnea, LE edema or unexpected weight gain. No syncope.   Past Medical History:  Diagnosis Date  . High blood cholesterol level    2012 lab results, tested again with normal levels  . Migraines    no aura    Past  Surgical History:  Procedure Laterality Date  . ORIF RADIAL FRACTURE Right 05/04/2017   Procedure: OPEN REDUCTION INTERNAL FIXATION (ORIF) RADIAL FRACTURE;  Surgeon: Leanora Cover, MD;  Location: [facility];  Service: Orthopedics;  Laterality: Right;  Right radial shaft     Current Medications: Current Outpatient Medications on File Prior to Visit  Medication Sig  . Prenatal Vit-Fe Fumarate-FA (PRENATAL VITAMIN PO) Take by mouth.  Marland Kitchen VITAMIN D PO Take 2,000 Int'l Units by mouth.   No current facility-administered medications on file prior to visit.     Allergies:   Patient has no known allergies.   Social History   Tobacco Use  . Smoking status: Never Smoker  . Smokeless tobacco: Never Used  Substance Use Topics  . Alcohol use: Yes    Alcohol/week: 2.0 standard drinks    Types: 2 Standard drinks or equivalent per week  . Drug use: No    Family History: family history includes Hyperlipidemia in her father; Hypertension in her maternal grandmother; Lymphoma in her father.  ROS:   Please see the history of present illness.  Additional pertinent ROS: Constitutional: Negative for chills, fever, night sweats, unintentional weight loss  HENT: Negative for ear pain and hearing loss.   Eyes: Negative for loss of vision and eye pain.  Respiratory: Negative for cough, sputum, wheezing.   Cardiovascular: See HPI. Gastrointestinal: Negative for abdominal pain, melena, and hematochezia.  Genitourinary: Negative for dysuria and hematuria.  Musculoskeletal: Negative for falls and myalgias.  Skin: Negative for itching and rash.  Neurological: Negative for focal weakness, focal sensory changes and loss of consciousness.  Endo/Heme/Allergies: Does not bruise/bleed easily.     EKGs/Labs/Other Studies Reviewed:    The following studies were reviewed today: No prior cardiac studies  EKG:  EKG is personally reviewed.  The ekg ordered today demonstrates NSR, iRBBB  Recent  Labs: 12/01/2019: Hemoglobin 13.3; Platelets 314; TSH 1.070  Recent Lipid Panel    Component Value Date/Time   CHOL 211 (H) 12/01/2019 1546   TRIG 235 (H) 12/01/2019 1546   HDL 53 12/01/2019 1546   CHOLHDL 4.0 12/01/2019 1546   LDLCALC 117 (H) 12/01/2019 1546   LDLDIRECT 120 (H) 01/09/2011 1002    Physical Exam:    VS:  BP 128/84   Pulse 95   Ht 5\' 4"  (1.626 m)   Wt 177 lb 9.6 oz (80.6 kg)   LMP 12/29/2019   SpO2 96%   BMI 30.48 kg/m     Wt Readings from Last 3 Encounters:  05/26/20 177 lb 9.6 oz (80.6 kg)  02/12/20 160 lb 9.6 oz (72.8 kg)  02/09/20 161 lb 1.6 oz (73.1 kg)    GEN: Well nourished, well developed in no acute distress HEENT: Normal, moist mucous membranes NECK: No JVD CARDIAC: regular rhythm, normal S1 and S2, no rubs or gallops. No murmurs. VASCULAR: Radial and DP pulses 2+ bilaterally. No carotid bruits RESPIRATORY:  Clear to auscultation without rales, wheezing or rhonchi  ABDOMEN: Soft, non-tender, gravid abdomen MUSCULOSKELETAL:  Ambulates independently SKIN: Warm and dry, no edema NEUROLOGIC:  Alert and oriented x 3. No focal neuro deficits noted. PSYCHIATRIC:  Normal affect    ASSESSMENT:    1. Heart palpitations   2. [redacted] weeks gestation of pregnancy   3. Cardiac risk counseling   4. Counseling on health promotion and disease prevention    PLAN:    Palpitations, shortness of breath, currently pregnant at [redacted] weeks gestation: -this has gradually decreased in frequency for her. Shortness of breath only occurred with palpitations. Now both are very rare events for her. -discussed Kardiamobile vs. Zio patch. Given the frequency of her symptoms, she does not feel that either needs to be pursued at this time. Will contact me if symptoms worsen, then will pursue additional testing -counseled on fluid shifts/changes as pregnancy progresses.  Cardiac risk counseling and prevention recommendations: -recommend heart healthy/Mediterranean diet, with whole  grains, fruits, vegetable, fish, lean meats, nuts, and olive oil. Limit salt. -recommend moderate walking, 3-5 times/week for 30-50 minutes each session. Aim for at least 150 minutes.week. Goal should be pace of 3 miles/hours, or walking 1.5 miles in 30 minutes -recommend avoidance of tobacco products. Avoid excess alcohol. -ASCVD risk score: The ASCVD Risk score Mikey Bussing DC Jr., et al., 2013) failed to calculate for the following reasons:   The 2013 ASCVD risk score is only valid for ages 70 to 30    Plan for follow up: as needed  Buford Dresser, MD, PhD [place]  [facility]    Medication Adjustments/Labs and Tests Ordered: Current medicines are reviewed at length with the patient today.  Concerns regarding medicines are outlined above.  Orders Placed This Encounter  Procedures  . EKG 12-Lead   No orders of the defined types were placed in this encounter.   Patient Instructions  Medication Instructions:  Your physician recommends that you continue on your current medications as directed. Please refer to the Current Medication list given to you today.  Labwork: NONE  Testing/Procedures: NONE  Follow-Up: AS NEEDED      Signed, Buford Dresser, MD PhD 05/26/2020  [facility]

## 2020-05-26 NOTE — Patient Instructions (Addendum)
Medication Instructions:  ?Your physician recommends that you continue on your current medications as directed. Please refer to the Current Medication list given to you today.  ? ?Labwork: ?NONE ? ?Testing/Procedures: ?NONE ? ?Follow-Up: ?AS NEEDED  ? ?  ?

## 2020-06-15 ENCOUNTER — Encounter: Payer: Self-pay | Admitting: Cardiology

## 2020-10-09 DIAGNOSIS — Z8616 Personal history of COVID-19: Secondary | ICD-10-CM

## 2020-10-09 HISTORY — DX: Personal history of COVID-19: Z86.16

## 2020-10-14 ENCOUNTER — Other Ambulatory Visit: Payer: 59

## 2020-12-01 ENCOUNTER — Ambulatory Visit: Payer: 59 | Admitting: Certified Nurse Midwife

## 2020-12-27 ENCOUNTER — Telehealth: Payer: Self-pay

## 2020-12-27 NOTE — Telephone Encounter (Addendum)
Patient e-mailed front desk requesting appt to have Paragard IUD inserted this week.  Last gyn annual exam was 11/30/19 with Deb. Hollice Espy, CNM but patient has has pregnancy/delivery since then.  Front desk is inquiring if she will need gyn AEX with you first or if you want to order the IUD now?

## 2020-12-29 NOTE — Telephone Encounter (Signed)
Left message to call me.

## 2020-12-29 NOTE — Telephone Encounter (Signed)
I would check if she had a PP visit after her delivery. If so, you can just schedule her for an IUD insertion and I can counsel her that day.  She needs to abstain from sex for 2 weeks prior to insertion.

## 2020-12-29 NOTE — Telephone Encounter (Signed)
Patient called. She said she did have a 6 weeks post partum visit and all was fine with that. She also said she had not had intercourse in the last two weeks.   She is scheduled for Friday to have IUD insertion.

## 2020-12-31 ENCOUNTER — Ambulatory Visit (INDEPENDENT_AMBULATORY_CARE_PROVIDER_SITE_OTHER): Payer: 59 | Admitting: Obstetrics and Gynecology

## 2020-12-31 ENCOUNTER — Encounter: Payer: Self-pay | Admitting: Obstetrics and Gynecology

## 2020-12-31 ENCOUNTER — Other Ambulatory Visit: Payer: Self-pay

## 2020-12-31 VITALS — BP 110/60 | HR 68 | Wt 159.0 lb

## 2020-12-31 DIAGNOSIS — Z01812 Encounter for preprocedural laboratory examination: Secondary | ICD-10-CM | POA: Diagnosis not present

## 2020-12-31 DIAGNOSIS — Z30014 Encounter for initial prescription of intrauterine contraceptive device: Secondary | ICD-10-CM | POA: Diagnosis not present

## 2020-12-31 DIAGNOSIS — Z30430 Encounter for insertion of intrauterine contraceptive device: Secondary | ICD-10-CM

## 2020-12-31 DIAGNOSIS — N83202 Unspecified ovarian cyst, left side: Secondary | ICD-10-CM | POA: Diagnosis not present

## 2020-12-31 LAB — PREGNANCY, URINE: Preg Test, Ur: NEGATIVE

## 2020-12-31 NOTE — Progress Notes (Signed)
GYNECOLOGY  VISIT   HPI: 35 y.o.   Married White or Caucasian Not Hispanic or Latino  female   [id number] here for Paragard IUD insertion. She has a 24 year old son and a 5 month old son.  She hasn't been sexually active since the birth of her son. Baseline cycles were q 33-34 days x 3-4 days with light flow and no cramping.   When she was pregnant she had an ultrasound on 05/06/20: A complex cyst is noted on the left ovary that is most consistent with a dermoid cyst. It measures approximately 4 cm in mean  diameter.  She was told to f/u after delivery.    GYNECOLOGIC HISTORY: No LMP recorded. Patient is pregnant. Contraception: none Menopausal hormone therapy: n/a        OB History    Gravida  2   Para  1   Term  1   Preterm  0   AB  0   Living  1     SAB  0   IAB  0   Ectopic  0   Multiple  0   Live Births  1        Obstetric Comments  FTNSVD           Patient Active Problem List   Diagnosis Date Noted  . Gastroesophageal reflux disease 02/05/2018  . Hiatal hernia 02/05/2018  . Pharyngoesophageal dysphagia 01/21/2018  . Uterine fibroids affecting pregnancy in second trimester 11/16/2015  . Encounter for supervision of normal first pregnancy in first trimester 10/12/2015  . Hx of migraines 09/21/2015  . Migraines 01/09/2011  . High blood cholesterol level     Past Medical History:  Diagnosis Date  . High blood cholesterol level    2012 lab results, tested again with normal levels  . Migraines    no aura    Past Surgical History:  Procedure Laterality Date  . ORIF RADIAL FRACTURE Right 05/04/2017   Procedure: OPEN REDUCTION INTERNAL FIXATION (ORIF) RADIAL FRACTURE;  Surgeon: Leanora Cover, MD;  Location: [facility];  Service: Orthopedics;  Laterality: Right;  Right radial shaft     Current Outpatient Medications  Medication Sig Dispense Refill  . Prenatal Vit-Fe Fumarate-FA (PRENATAL VITAMIN PO) Take by mouth.    Marland Kitchen VITAMIN D PO  Take 2,000 Int'l Units by mouth.     No current facility-administered medications for this visit.     ALLERGIES: Patient has no known allergies.  Family History  Problem Relation Age of Onset  . Lymphoma Father   . Hyperlipidemia Father   . Hypertension Maternal Grandmother     Social History   Socioeconomic History  . Marital status: Married    Spouse name: Clayton Jarmon  . Number of children: Not on file  . Years of education: Masters   . Highest education level: Not on file  Occupational History  . Occupation: Pharmacist, hospital     Comment: [employer]  Tobacco Use  . Smoking status: Never Smoker  . Smokeless tobacco: Never Used  Substance and Sexual Activity  . Alcohol use: Yes    Alcohol/week: 2.0 standard drinks    Types: 2 Standard drinks or equivalent per week  . Drug use: No  . Sexual activity: Yes    Partners: Male    Birth control/protection: None  Other Topics Concern  . Not on file  Social History Narrative  . Not on file   Social Determinants of Health   Financial  Resource Strain: Not on file  Food Insecurity: Not on file  Transportation Needs: Not on file  Physical Activity: Not on file  Stress: Not on file  Social Connections: Not on file  Intimate Partner Violence: Not on file    Review of Systems  Constitutional: Negative.   HENT: Negative.   Eyes: Negative.   Respiratory: Negative.   Cardiovascular: Negative.   Gastrointestinal: Negative.   Genitourinary: Negative.   Musculoskeletal: Negative.   Skin: Negative.   Neurological: Negative.   Endo/Heme/Allergies: Negative.   Psychiatric/Behavioral: Negative.     PHYSICAL EXAMINATION:    BP 118/60   Pulse 76   Wt 159 lb (72.1 kg)   BMI 27.29 kg/m     General appearance: alert, cooperative and appears stated age  Pelvic: External genitalia:  no lesions              Urethra:  normal appearing urethra with no masses, tenderness or lesions              Bartholins and Skenes:  normal                 Vagina: normal appearing vagina with normal color and discharge, no lesions              Cervix: no lesions              Bimanual Exam:  Uterus:  normal size, contour, position, consistency, mobility, non-tender, anteverted              Adnexa: no mass, fullness, tenderness               Chaperone was present for exam.  The risks of the Paragard IUD were reviewed with the patient, including infection, abnormal bleeding and uterine perfortion. Consent was signed.  A speculum was placed in the vagina, the cervix was cleansed with betadine. A tenaculum was placed on the cervix, the uterus sounded to 9 cm. The cervix was dilated to a 4 Hagar dilator  The Paragard IUD was inserted without difficulty. The string were cut to 3-4 cm. The tenaculum was removed. Slight oozing from the tenaculum site was stopped with pressure.   The patient tolerated the procedure well. She did sit up right after the procedure, got light headed and needed to lie down. Vitals repeated, normal. Patient recovered well.   1. Encounter for IUD insertion Paragard IUD inserted  2. Left ovarian cyst Reviewed ultrasound results from 7/21, explained results to the patient. Discussed dermoid cysts and risks of torsion, rupture and malignant transformation - US PELVIS TRANSVAGINAL NON-OB (TV ONLY); Future  3. Pre-procedural laboratory examination - Pregnancy, urine, negative  4. Encounter for initial prescription of intrauterine contraceptive device (IUD) Paragard placed

## 2020-12-31 NOTE — Patient Instructions (Addendum)
Ovarian Cyst  An ovarian cyst is a fluid-filled sac that forms on an ovary. The ovaries are small organs that produce eggs in women. Various types of cysts can form on the ovaries. Some may cause symptoms and require treatment. Most ovarian cysts go away on their own, are not cancerous (are benign), and do not cause problems. What are the causes? Ovarian cysts may be caused by:  Ovarian hyperstimulation syndrome. This is a condition that can develop from taking fertility medicines. It causes multiple large ovarian cysts to form.  Polycystic ovarian syndrome (PCOS). This is a common hormonal disorder that can cause ovarian cysts to form, and can cause problems with your period or fertility.  The normal menstrual cycle. What increases the risk? The following factors may make you more likely to develop this condition:  Being overweight or obese.  Taking fertility medicines.  Taking certain forms of hormonal birth control.  Smoking. What are the signs or symptoms? Many ovarian cysts do not cause symptoms. If symptoms are present, they may include:  Pelvic pain or pressure.  Pain in the lower abdomen.  Pain during sex.  Abdominal swelling.  Abnormal menstrual periods.  Increasing pain with menstrual periods. How is this diagnosed? These cysts are commonly found during a routine pelvic exam. You may have tests to find out more about the cyst, such as:  Ultrasound.  CT scan.  MRI.  Blood tests. How is this treated? Many ovarian cysts go away on their own without treatment. Your health care provider may want to check your cyst regularly for 2-3 months to see if it changes. If you are in menopause, it is especially important to have your cyst monitored closely because menopausal women have a higher rate of ovarian cancer. When treatment is needed, it may include:  Medicines to help relieve pain.  A procedure to drain the cyst (aspiration).  Surgery to remove the whole cyst  (cystectomy).  Hormone treatment or birth control pills. These methods are sometimes used to help keep cysts from coming back.  Surgery to remove the ovary (oophorectomy). Follow these instructions at home:  Take over-the-counter and prescription medicines only as told by your health care provider.  Ask your health care provider if any medicine prescribed to you requires you to avoid driving or using machinery.  Get regular pelvic exams and Pap tests as often as told by your health care provider.  Return to your normal activities as told by your health care provider. Ask your health care provider what activities are safe for you.  Do not use any products that contain nicotine or tobacco, such as cigarettes, e-cigarettes, and chewing tobacco. If you need help quitting, ask your health care provider.  Keep all follow-up visits. This is important. Contact a health care provider if:  Your periods are late, irregular, painful, or they stop.  You have pelvic pain that does not go away.  You have pressure on your bladder or trouble emptying your bladder completely.  You have any of the following: ? A feeling of fullness. ? You are gaining weight or losing weight without changing your exercise and eating habits. ? Pain, swelling, or bloating in the abdomen. ? Loss of appetite. ? Pain and pressure in your back and pelvis.  You think you may be pregnant. Get help right away if:  You have abdominal or pelvic pain that is severe or gets worse.  You cannot eat or drink without vomiting.  You suddenly develop a fever   or chills.  Your menstrual period is much heavier than usual. Summary  An ovarian cyst is a fluid-filled sac that forms on an ovary.  Some ovarian cysts may cause symptoms and require treatment.  These cysts are commonly found during a routine pelvic exam.  Many ovarian cysts go away on their own without treatment. This information is not intended to replace advice  given to you by your health care provider. Make sure you discuss any questions you have with your health care provider. Document Revised: 03/04/2020 Document Reviewed: 03/04/2020 Elsevier Patient Education  2021 [employer].  IUD Post-procedure Instructions . Cramping is common.  You may take Ibuprofen, Aleve, or Tylenol for the cramping.  This should resolve within 24 hours.   . You may have a small amount of spotting.  You should wear a mini pad for the next few days. . You may have intercourse in 24 hours. . You need to call the office if you have any pelvic pain, fever, heavy bleeding, or foul smelling vaginal discharge. . Shower or bathe as normal

## 2021-01-03 ENCOUNTER — Other Ambulatory Visit: Payer: Self-pay

## 2021-01-03 DIAGNOSIS — N83202 Unspecified ovarian cyst, left side: Secondary | ICD-10-CM

## 2021-02-03 ENCOUNTER — Telehealth: Payer: Self-pay | Admitting: Obstetrics and Gynecology

## 2021-02-03 ENCOUNTER — Ambulatory Visit (INDEPENDENT_AMBULATORY_CARE_PROVIDER_SITE_OTHER): Payer: 59

## 2021-02-03 ENCOUNTER — Other Ambulatory Visit: Payer: Self-pay

## 2021-02-03 ENCOUNTER — Ambulatory Visit (INDEPENDENT_AMBULATORY_CARE_PROVIDER_SITE_OTHER): Payer: 59 | Admitting: Obstetrics and Gynecology

## 2021-02-03 ENCOUNTER — Encounter: Payer: Self-pay | Admitting: Obstetrics and Gynecology

## 2021-02-03 VITALS — BP 110/62 | HR 66 | Ht 64.0 in | Wt 159.4 lb

## 2021-02-03 DIAGNOSIS — N83202 Unspecified ovarian cyst, left side: Secondary | ICD-10-CM

## 2021-02-03 DIAGNOSIS — T8332XA Displacement of intrauterine contraceptive device, initial encounter: Secondary | ICD-10-CM

## 2021-02-03 DIAGNOSIS — K429 Umbilical hernia without obstruction or gangrene: Secondary | ICD-10-CM

## 2021-02-03 NOTE — Patient Instructions (Signed)
Diagnostic Laparoscopy Diagnostic laparoscopy is a procedure to diagnose problems in the abdomen. It might be done for a variety of reasons, such as to look for scar tissue, a reason for abdominal pain, an abdominal mass or tumor, or fluid in the abdomen (ascites). This procedure may also be done to remove a tissue sample from the liver to look at under a microscope (biopsy). During the procedure, a thin, flexible tube that has a light and a camera on the end (laparoscope) is inserted through a small incision in the abdomen. The image from the camera is shown on a monitor to help the surgeon see inside the body. Tell a health care provider about:  Any allergies you have.  All medicines you are taking, including vitamins, herbs, eye drops, creams, and over-the-counter medicines.  Any problems you or family members have had with anesthetic medicines.  Any blood disorders you have.  Any surgeries you have had.  Any medical conditions you have.  Whether you are pregnant or may be pregnant. What are the risks? Generally, this is a safe procedure. However, problems may occur, including:  Infection.  Bleeding.  Allergic reactions to medicines or dyes.  Damage to abdominal structures or organs, such as the intestines, liver, stomach, or spleen. What happens before the procedure? Staying hydrated Follow instructions from your health care provider about hydration, which may include:  Up to 2 hours before the procedure - you may continue to drink clear liquids, such as water, clear fruit juice, black coffee, and plain tea.   Eating and drinking restrictions Follow instructions from your health care provider about eating and drinking, which may include:  8 hours before the procedure - stop eating heavy meals or foods, such as meat, fried foods, or fatty foods.  6 hours before the procedure - stop eating light meals or foods, such as toast or cereal.  6 hours before the procedure - stop  drinking milk or drinks that contain milk.  2 hours before the procedure - stop drinking clear liquids. Medicines Ask your health care provider about:  Changing or stopping your regular medicines. This is especially important if you are taking diabetes medicines or blood thinners.  Taking medicines such as aspirin and ibuprofen. These medicines can thin your blood. Do not take these medicines unless your health care provider tells you to take them.  Taking over-the-counter medicines, vitamins, herbs, and supplements. General instructions  Ask your health care provider: ? How your surgery site will be marked. ? What steps will be taken to help prevent infection. These steps may include:  Removing hair at the surgery site.  Washing skin with a germ-killing soap.  Taking antibiotic medicine.  Plan to have a responsible adult take you home from the hospital or clinic.  Plan to have a responsible adult care for you for the time you are told after you leave the hospital or clinic. This is important. What happens during the procedure?  An IV will be inserted into one of your veins.  You will be given one or more of the following: ? A medicine to help you relax (sedative). ? A medicine to numb the area (local anesthetic). ? A medicine to make you fall asleep (general anesthetic).  A breathing tube will be placed down your throat to help you breathe during the procedure.  Your abdomen will be filled with an air-like gas so that your abdomen expands. This will give the surgeon more room to operate and will make  your organs easier to see.  Many small incisions will be made in your abdomen.  A laparoscope and other surgical instruments will be inserted into your abdomen through these incisions.  A biopsy may be done. This will depend on the reason why you are having this procedure.  The laparoscope and other instruments will be removed from your abdomen.  The air-like gas will be  released from your abdomen.  Your incisions will be closed with stitches (sutures), skin glue, or surgical tapes and covered with a bandage (dressing).  Your breathing tube will be removed. The procedure may vary among health care providers and hospitals.   What happens after the procedure?  Your blood pressure, heart rate, breathing rate, and blood oxygen level will be monitored until you leave the hospital or clinic.  If you were given a sedative during the procedure, it can affect you for several hours. Do not drive or operate machinery until your health care provider says that it is safe.  It is up to you to get the results of your procedure. Ask your health care provider, or the department that is doing the procedure, when your results will be ready. Summary  Diagnostic laparoscopy is a procedure to diagnose problems in the abdomen using a thin, flexible tube that has a light and a camera on the end (laparoscope).  Follow instructions from your health care provider about how to prepare for the procedure.  Plan to have a responsible adult care for you for the time you are told after you leave the hospital or clinic. This is important. This information is not intended to replace advice given to you by your health care provider. Make sure you discuss any questions you have with your health care provider. Document Revised: 05/21/2020 Document Reviewed: 05/21/2020 Elsevier Patient Education  2021 [place]. Umbilical Hernia, Adult  A hernia is a bulge of tissue that pushes through an opening between muscles. An umbilical hernia happens in the abdomen, near the belly button (umbilicus). The hernia may contain tissues from the small intestine, large intestine, or fatty tissue covering the intestines (omentum). Umbilical hernias in adults tend to get worse over time, and they require surgical treatment. There are several types of umbilical hernias. You may have:  A hernia located just  above or below the umbilicus (indirect hernia). This is the most common type of umbilical hernia in adults.  A hernia that forms through an opening formed by the umbilicus (direct hernia).  A hernia that comes and goes (reducible hernia). A reducible hernia may be visible only when you strain, lift something heavy, or cough. This type of hernia can be pushed back into the abdomen (reduced).  A hernia that traps abdominal tissue inside the hernia (incarcerated hernia). This type of hernia cannot be reduced.  A hernia that cuts off blood flow to the tissues inside the hernia (strangulated hernia). The tissues can start to die if this happens. This type of hernia requires emergency treatment. What are the causes? An umbilical hernia happens when tissue inside the abdomen presses on a weak area of the abdominal muscles. What increases the risk? You may have a greater risk of this condition if you:  Are obese.  Have had several pregnancies.  Have a buildup of fluid inside your abdomen (ascites).  Have had surgery that weakens the abdominal muscles. What are the signs or symptoms? The main symptom of this condition is a painless bulge at or near the belly button. A  reducible hernia may be visible only when you strain, lift something heavy, or cough. Other symptoms may include:  Dull pain.  A feeling of pressure. Symptoms of a strangulated hernia may include:  Pain that gets increasingly worse.  Nausea and vomiting.  Pain when pressing on the hernia.  Skin over the hernia becoming red or purple.  Constipation.  Blood in the stool. How is this diagnosed? This condition may be diagnosed based on:  A physical exam. You may be asked to cough or strain while standing. These actions increase the pressure inside your abdomen and force the hernia through the opening in your muscles. Your health care provider may try to reduce the hernia by pressing on it.  Your symptoms and medical  history. How is this treated? Surgery is the only treatment for an umbilical hernia. Surgery for a strangulated hernia is done as soon as possible. If you have a small hernia that is not incarcerated, you may need to lose weight before having surgery. Follow these instructions at home:  Lose weight, if told by your health care provider.  Do not try to push the hernia back in.  Watch your hernia for any changes in color or size. Tell your health care provider if any changes occur.  You may need to avoid activities that increase pressure on your hernia.  Do not lift anything that is heavier than 10 lb (4.5 kg) until your health care provider says that this is safe.  Take over-the-counter and prescription medicines only as told by your health care provider.  Keep all follow-up visits as told by your health care provider. This is important. Contact a health care provider if:  Your hernia gets larger.  Your hernia becomes painful. Get help right away if:  You develop sudden, severe pain near the area of your hernia.  You have pain as well as nausea or vomiting.  You have pain and the skin over your hernia changes color.  You develop a fever. This information is not intended to replace advice given to you by your health care provider. Make sure you discuss any questions you have with your health care provider. Document Revised: 11/07/2017 Document Reviewed: 03/26/2017 Elsevier Patient Education  [place].

## 2021-02-03 NOTE — Progress Notes (Signed)
GYNECOLOGY  VISIT   HPI: 35 y.o.   Married White or Caucasian Not Hispanic or Latino  female   [phone] with No LMP recorded. Patient is pregnant.   here for evaluation of an ovarian cyst that was diagnosed in her last pregnancy.  05/06/20: A complex cyst is noted on the left ovary that is most consistent with a dermoid cyst. It measures approximately 4 cm in mean  diameter.  She was told to f/u after delivery.   She had a paragard IUD inserted last month. No c/o.    GYNECOLOGIC HISTORY: No LMP recorded. Patient is pregnant. Contraception:IUD Menopausal hormone therapy: none        OB History    Gravida  3   Para  2   Term  2   Preterm  0   AB  0   Living  2     SAB  0   IAB  0   Ectopic  0   Multiple  0   Live Births  2        Obstetric Comments  FTNSVD           Patient Active Problem List   Diagnosis Date Noted  . Gastroesophageal reflux disease 02/05/2018  . Hiatal hernia 02/05/2018  . Pharyngoesophageal dysphagia 01/21/2018  . Uterine fibroids affecting pregnancy in second trimester 11/16/2015  . Encounter for supervision of normal first pregnancy in first trimester 10/12/2015  . Hx of migraines 09/21/2015  . Migraines 01/09/2011  . High blood cholesterol level     Past Medical History:  Diagnosis Date  . High blood cholesterol level    2012 lab results, tested again with normal levels  . Migraines    no aura    Past Surgical History:  Procedure Laterality Date  . ORIF RADIAL FRACTURE Right 05/04/2017   Procedure: OPEN REDUCTION INTERNAL FIXATION (ORIF) RADIAL FRACTURE;  Surgeon: Leanora Cover, MD;  Location: [facility];  Service: Orthopedics;  Laterality: Right;  Right radial shaft     Current Outpatient Medications  Medication Sig Dispense Refill  . Prenatal Vit-Fe Fumarate-FA (PRENATAL VITAMIN PO) Take by mouth.    Marland Kitchen VITAMIN D PO Take 2,000 Int'l Units by mouth.     No current facility-administered medications for  this visit.     ALLERGIES: Patient has no known allergies.  Family History  Problem Relation Age of Onset  . Lymphoma Father   . Hyperlipidemia Father   . Hypertension Maternal Grandmother     Social History   Socioeconomic History  . Marital status: Married    Spouse name: Jammie Troup  . Number of children: Not on file  . Years of education: Masters   . Highest education level: Not on file  Occupational History  . Occupation: Pharmacist, hospital     Comment: [employer]  Tobacco Use  . Smoking status: Never Smoker  . Smokeless tobacco: Never Used  Substance and Sexual Activity  . Alcohol use: Yes    Alcohol/week: 2.0 standard drinks    Types: 2 Standard drinks or equivalent per week  . Drug use: No  . Sexual activity: Yes    Partners: Male    Birth control/protection: None  Other Topics Concern  . Not on file  Social History Narrative  . Not on file   Social Determinants of Health   Financial Resource Strain: Not on file  Food Insecurity: Not on file  Transportation Needs: Not on file  Physical Activity: Not  on file  Stress: Not on file  Social Connections: Not on file  Intimate Partner Violence: Not on file    ROS  PHYSICAL EXAMINATION:    There were no vitals taken for this visit.    General appearance: alert, cooperative and appears stated age Abdomen: soft, non-tender; non distended, no masses,  no organomegaly. She does have an umbilical hernia, not tender, not reducible. It is in her upper umbilicus.   Pelvic: External genitalia:  no lesions              Urethra:  normal appearing urethra with no masses, tenderness or lesions              Bartholins and Skenes: normal                 Vagina: normal appearing vagina with normal color and discharge, no lesions              Cervix: no lesions and IUD strings 3 cm              Bimanual Exam:  Uterus:  normal size, contour, position, consistency, mobility, non-tender              Adnexa: no mass,  fullness, tenderness                Chaperone was present for exam.  Pelvic ultrasound  Indications: ovarian cyst  Findings:  Uterus 7.3 x 5.63 x 4.05 cm  Endometrium 3.8 mm  IUD in the endometrial cavity. The arms of the IUD are not level, question of one of the arms penetrating the myometrium  Left ovary 6.72 x 4.49 x 5.61 cm  6.72 x 4.49 cm complex ovarian cyst consistent with dermoid, avascular  Right ovary 2.93 x 1.97 x 2.32 cm  No free fluid   Impression:  Anteverted, normal sized uterus IUD in endometrial cavity, possible protrusion of the right arm of the IUD into the myometrium 6.7 cm complex, left ovarian cyst consistent with dermoid (increased from 5.3 x 3.7 x 4.2 cm in 5/21) No free fluid  1. Left ovarian cyst 6.7 cm complex cyst c/w a dermoid, enlarging in size.  The patient is aware of the risks of torsion, rupture and malignant transformation. Removal recommended. Plan: diagnostic laparoscopy, left ovarian cystectomy, possible left oophorectomy. Will try to coordinate with umbilical hernia repair  2. Malpositioned intrauterine device (IUD), initial encounter Possible myometrial penetration by one of the arms of the IUD. Will plan hysteroscopy, if penetrating the myometrium will remove the IUD and place a new paragard IUD in the OR  3. Umbilical hernia without obstruction and without gangrene Send for surgery consultation  In addition to reviewing the ultrasound images with the patient, over 20 minutes was spent in management, discussion of care.

## 2021-02-03 NOTE — Telephone Encounter (Signed)
  This patient needs a general surgery consult. She has a non reducible umbilical hernia. I would like to coordinate surgery with General Surgery  Surgery: laparoscopic left ovarian cystectomy, possible left oophorectomy, hysteroscopy, possible removal and reinsertion of a new paragard IUD & hernia repair by general surgery  Diagnosis: left ovarian dermoid. Umbilical hernia, possible malpositioned IUD  Location: [facility]  Status: Outpatient  Time: 1 Minutes  Assistant: Josefa Half, MD, Cherlynn June  Urgency: First Available  Pre-Op Appointment: To Be Scheduled  Post-Op Appointment(s): 2 Weeks  Time Out Of Work: 2 [place]

## 2021-02-07 ENCOUNTER — Telehealth: Payer: Self-pay | Admitting: *Deleted

## 2021-02-07 DIAGNOSIS — K420 Umbilical hernia with obstruction, without gangrene: Secondary | ICD-10-CM

## 2021-02-07 NOTE — Telephone Encounter (Signed)
See telephone encounter dated 02/07/21.

## 2021-02-07 NOTE — Telephone Encounter (Signed)
Salvadore Dom, MD     02/03/21 12:10 PM Note  This patient needs a general surgery consult. She has a non reducible umbilical hernia. I would like to coordinate surgery with General Surgery  Surgery: laparoscopic left ovarian cystectomy, possible left oophorectomy, hysteroscopy, possible removal and reinsertion of a new paragard IUD & hernia repair by general surgery  Diagnosis: left ovarian dermoid. Umbilical hernia, possible malpositioned IUD  Location: [facility]  Status: Outpatient  Time: 30 Minutes  Assistant: Josefa Half, MD, Cherlynn June  Urgency: First Available  Pre-Op Appointment: To Be Scheduled  Post-Op Appointment(s): 2 Weeks  Time Out Of Work: 2 [place]

## 2021-02-07 NOTE — Telephone Encounter (Signed)
Left message to call Sharee Pimple, RN at [facility], [phone].   Referral placed to general surgery/ [facility].    Call placed to [facility], spoke with [facility]. Was advised once patient has been seen for consult, if surgery is recommended, surgery scheduler will f/u with [facility] to coordinate surgery.

## 2021-02-08 NOTE — Telephone Encounter (Signed)
Left message to call Kharisma Glasner, RN at [facility], [phone].  

## 2021-02-18 NOTE — Telephone Encounter (Signed)
Patient is returning call to [facility]. Patient states if she doesn't answer a detailed message can be left on voicemail.

## 2021-02-21 NOTE — Telephone Encounter (Signed)
Spoke with patient.  Advised patient referral was placed to [facility] for consult for hernia repair, they should contact you direct to schedule appt. Once consult has been completed can proceed with coordinating surgery with general surgeon if recommended. Number provided to [facility], patient will f/u once consult is scheduled.  Questions answered.

## 2021-02-22 NOTE — Telephone Encounter (Signed)
Patient returned call on 5/16, left message stating she is scheduled for consult with [facility] on 03/24/21. Patient is aware I will f/u after consult for surgery planning.   Routing to Dr. Rosann Auerbach. I will keep surgery chart to f/u with patient after consult.   Encounter closed.   Cc: KimAlexis

## 2021-03-04 ENCOUNTER — Ambulatory Visit: Payer: 59 | Admitting: Nurse Practitioner

## 2021-03-04 ENCOUNTER — Encounter: Payer: Self-pay | Admitting: Nurse Practitioner

## 2021-03-04 ENCOUNTER — Other Ambulatory Visit: Payer: Self-pay

## 2021-03-04 ENCOUNTER — Encounter: Payer: Self-pay | Admitting: Obstetrics and Gynecology

## 2021-03-04 VITALS — BP 102/68 | HR 89 | Temp 99.2°F | Resp 18

## 2021-03-04 DIAGNOSIS — N61 Mastitis without abscess: Secondary | ICD-10-CM | POA: Diagnosis not present

## 2021-03-04 MED ORDER — AMOXICILLIN-POT CLAVULANATE 875-125 MG PO TABS: 1.0000 | ORAL_TABLET | Freq: Two times a day (BID) | ORAL | 0 refills | Status: DC

## 2021-03-04 NOTE — Progress Notes (Signed)
GYNECOLOGY  VISIT  CC:  Right breast rash, noticed symptoms this morning. Warm to touch.   HPI: 35 y.o. [id number] Married White or Caucasian female here for pain and warm in right breast since she woke up this morning. Has been feeding more and using warm compresses but the pain and red streaking is still increasing. Does not feel sick in any way. Temperature is 99.2. Had mastitis with her last child as well, but it started with a fever.  Delivered 2 months ago at a birthing center in [place]. Nursing with out problems. Baby is taking both sides well.      GYNECOLOGIC HISTORY: No LMP recorded. Patient is nursing.   Patient Active Problem List   Diagnosis Date Noted  . Gastroesophageal reflux disease 02/05/2018  . Hiatal hernia 02/05/2018  . Pharyngoesophageal dysphagia 01/21/2018  . Uterine fibroids affecting pregnancy in second trimester 11/16/2015  . Encounter for supervision of normal first pregnancy in first trimester 10/12/2015  . Hx of migraines 09/21/2015  . Migraines 01/09/2011  . High blood cholesterol level     Past Medical History:  Diagnosis Date  . High blood cholesterol level    2012 lab results, tested again with normal levels  . Migraines    no aura    Past Surgical History:  Procedure Laterality Date  . ORIF RADIAL FRACTURE Right 05/04/2017   Procedure: OPEN REDUCTION INTERNAL FIXATION (ORIF) RADIAL FRACTURE;  Surgeon: Leanora Cover, MD;  Location: [facility];  Service: Orthopedics;  Laterality: Right;  Right radial shaft     MEDS:   Current Outpatient Medications on File Prior to Visit  Medication Sig Dispense Refill  . Prenatal Vit-Fe Fumarate-FA (PRENATAL VITAMIN PO) Take by mouth.    Marland Kitchen VITAMIN D PO Take 2,000 Int'l Units by mouth.     No current facility-administered medications on file prior to visit.    ALLERGIES: Patient has no known allergies.  Family History  Problem Relation Age of Onset  . Lymphoma Father   .  Hyperlipidemia Father   . Hypertension Maternal Grandmother      Review of Systems  PHYSICAL EXAMINATION:    BP 102/68 (BP Location: Right Arm)   Pulse 89   Temp 99.2 F (37.3 C) (Oral)   Resp 18   LMP 02/18/2021 (Exact Date)   SpO2 98%   Breastfeeding Yes     General appearance: alert, cooperative, no acute distress Breasts: Redness of right breast in the 2 o'clock position, streaking of pink lines visible, firm but not hard in that area as well, warm but not hot to the touch. Nipples are bilaterally intact with out signs of cracking or infection.  Physical Exam Chest:         Assessment: Mastitis - Plan: amoxicillin-clavulanate (AUGMENTIN) 875-125 MG tablet  Plan: Encouraged continued warm compresses and expressing. It is okay and encouraged to nurse on right side. May even pump in between feedings. Encouraged breast massage.  May try these measures first before starting antibiotics to give a little more time to heal. Since it is Friday of a long weekend, rx written if needed.  If symptoms: increased pain, further streaking, fever, flu-like malaise, larger area of redness, start antibiotics.

## 2021-03-04 NOTE — Patient Instructions (Signed)
Mastitis  Mastitis is inflammation of the breast tissue. It occurs most often in women who are breastfeeding, but it can also affect other women, and sometimes even men. What are the causes? This condition is usually caused by a bacterial infection. Bacteria enter the breast tissue through cuts or openings in the skin. Typically, this occurs with breastfeeding because of cracked or irritated nipples. Sometimes, it can occur when there is no opening in the skin. This is usually caused by plugged milk ducts. Other causes include:  Nipple piercing.  Some forms of breast cancer. What are the signs or symptoms? Symptoms of this condition include:  Swelling, redness, tenderness, and pain in an area of the breast. The area may also feel warm to the touch. These symptoms usually affect the upper part of the breast, toward the armpit region.  Swelling of the glands under the arm on the same side.  Fever.  Rapid pulse.  Fatigue, headache, and flu-like muscle aches. If an infection is allowed to progress, a collection of pus (abscess) may develop. How is this diagnosed? This condition can usually be diagnosed based on a physical exam and your symptoms. You may also have other tests, such as:  Blood tests to determine if your body is fighting a bacterial infection.  Mammogram or ultrasound tests to rule out other problems or diseases.  Testing of pus and other fluids. Pus from the breast may be collected and examined in the lab. If an abscess has developed, the fluid in the abscess can be removed with a needle. This test can be used to confirm the diagnosis and identify the bacteria present.  If you are breastfeeding, breast milk may be cultured and tested for bacteria. How is this treated? Treatment for this condition may include:  Applying heat or cold compresses to the affected area.  Medicine for pain.  Antibiotic medicine to treat a bacterial infection. This is usually taken by  mouth.  Self-care such as rest and increased fluid intake.  If an abscess has developed, it may be treated by removing fluid with a needle. Mastitis that occurs with breastfeeding will sometimes go away on its own, so your health care provider may choose to wait 24 hours after first seeing you to decide whether a prescription medicine is needed. You may be told of different ways to help manage breastfeeding, such as continuing to breastfeed or pump in order to ensure adequate milk flow. Follow these instructions at home: Medicines  Take over-the-counter and prescription medicines only as told by your health care provider.  If you were prescribed an antibiotic medicine, take it as told by your health care provider. Do not stop taking the antibiotic even if you start to feel better. General instructions  Do not wear a tight or underwire bra. Wear a soft, supportive bra.  Increase your fluid intake, especially if you have a fever.  Get plenty of rest. If you are breastfeeding:  Continue to empty your breasts as often as possible either by breastfeeding or by using a breast pump. This will decrease the pressure and the pain that comes with it. Ask your health care provider if changes need to be made to your breastfeeding or pumping routine.  Keep your nipples clean and dry.  During breastfeeding, empty the first breast completely before going to the other breast. If your baby is not emptying your breasts completely, use a breast pump to empty your breasts.  Use breast massage during feeding or pumping sessions.    If directed, apply moist heat to the affected area of your breast right before breastfeeding or pumping. Use the heat source that your health care provider recommends.  If directed, put ice on the affected area of your breast right after breastfeeding or pumping: ? Put ice in a plastic bag. ? Place a towel between your skin and the bag. ? Leave the ice on for 20 minutes.  If  you go back to work, pump your breasts while at work to stay within your nursing schedule.  Avoid allowing your breasts to become overly filled with milk (engorged). Contact a health care provider if:  You have pus-like discharge from the breast.  You have a fever.  Your symptoms do not improve within 2 days of starting treatment. Get help right away if:  Your pain and swelling are getting worse.  You have pain that is not controlled with medicine.  You have a red line extending from the breast toward your armpit. Summary  Mastitis is inflammation of the breast tissue. It occurs most often in women who are breastfeeding, but it can also affect non-breastfeeding women and some men.  This condition is usually caused by a bacterial infection.  This condition may be treated with hot and cold compresses, medicines, self-care, and certain breastfeeding strategies.  If you were prescribed an antibiotic medicine, take it as told by your health care provider. Do not stop taking the antibiotic even if you start to feel better. This information is not intended to replace advice given to you by your health care provider. Make sure you discuss any questions you have with your health care provider. Document Revised: 09/07/2017 Document Reviewed: 10/17/2016 Elsevier Patient Education  2021 [employer].

## 2021-03-24 ENCOUNTER — Ambulatory Visit: Payer: Self-pay | Admitting: Surgery

## 2021-03-29 ENCOUNTER — Telehealth: Payer: Self-pay | Admitting: *Deleted

## 2021-03-29 NOTE — Telephone Encounter (Signed)
Caryl Pina from [facility] called to coordinate surgery as seen below. Reviewed dates, Caryl Pina will return call.     Salvadore Dom, MD       02/03/21 12:10 PM Note   This patient needs a general surgery consult. She has a non reducible umbilical hernia. I would like to coordinate surgery with General Surgery   Surgery: laparoscopic left ovarian cystectomy, possible left oophorectomy, hysteroscopy, possible removal and reinsertion of a new paragard IUD & hernia repair by general surgery   Diagnosis: left ovarian dermoid. Umbilical hernia, possible malpositioned IUD   Location: [facility]   Status: Outpatient   Time: 35 Minutes   Assistant: Josefa Half, MD, Cherlynn June   Urgency: First Available   Pre-Op Appointment: To Be Scheduled   Post-Op Appointment(s): 2 Weeks   Time Out Of Work: 2 [place]

## 2021-03-30 NOTE — Telephone Encounter (Signed)
Spoke with patient. Provided update per conversation with Abigail Hughes at [facility]. Reviewed potential surgery dates. Advised patient I will f/u with additional information when surgeon confirms availability. Patient agreeable.

## 2021-04-01 NOTE — Telephone Encounter (Signed)
Surgery posted for 05/24/21. Confirmed with Maudie Mercury in [facility].  Dr. Talbert Nan will start case at 22, surgeon to start at 0900.

## 2021-04-01 NOTE — Telephone Encounter (Signed)
Spoke with Caryl Pina at [facility]. OK to proceed with surgery on 05/24/21 with Dr. Thermon Leyland.    Call placed to patient, left detailed message, ok per dpr. Advised will proceed with surgery on 05/24/21 at [facility]. I will return call once date/time confirmed. Return call to office if any additional questions.

## 2021-04-04 NOTE — Telephone Encounter (Signed)
Left message to call Kelani Robart, RN at [facility], [phone].  

## 2021-04-05 NOTE — Telephone Encounter (Signed)
Spoke with patient. Surgery date request confirmed.  Advised surgery is scheduled for 05/24/21 at 0730. Combined surgery with [facility]/ Dr. Thermon Leyland.   Surgery instruction sheet and hospital brochure reviewed, printed copy will be mailed.  Patient advised if Covid screening and quarantine requirements and agreeable.   Pre-op scheduled for 05/02/21.   Routing to provider. Encounter closed.   Cc: [facility]

## 2021-04-11 ENCOUNTER — Encounter: Payer: Self-pay | Admitting: Obstetrics and Gynecology

## 2021-05-02 ENCOUNTER — Other Ambulatory Visit: Payer: Self-pay

## 2021-05-02 ENCOUNTER — Ambulatory Visit: Payer: 59 | Admitting: Obstetrics and Gynecology

## 2021-05-02 VITALS — BP 120/74 | HR 66 | Wt 157.0 lb

## 2021-05-02 DIAGNOSIS — T8332XA Displacement of intrauterine contraceptive device, initial encounter: Secondary | ICD-10-CM

## 2021-05-02 DIAGNOSIS — N83202 Unspecified ovarian cyst, left side: Secondary | ICD-10-CM

## 2021-05-02 DIAGNOSIS — K429 Umbilical hernia without obstruction or gangrene: Secondary | ICD-10-CM

## 2021-05-02 NOTE — Progress Notes (Signed)
GYNECOLOGY  VISIT   HPI: 35 y.o.   Married White or Caucasian Not Hispanic or Latino  female   [phone] with No LMP recorded. (Menstrual status: IUD).   here for a preoperative visit. She as a complex left ovarian cyst c/w a dermoid. It was first noted in 7/21 and measured 4 cm. In 4/22 the cyst measured 6.72 x 4.49 cm, again c/w a dermoid.    At the time of her ultrasound in 4/22 one of the arms of the paragard was thought to possibly be penetrating the myometrium. She is having a lot of BTB with the IUD.  She started her cycles at 3 months partum in April. The IUD was inserted on 12/31/20. First cycle was in April.   She is not planning on having any more children.   GYNECOLOGIC HISTORY: No LMP recorded. (Menstrual status: IUD). Contraception:Paragard IUD (inserted in 3/22) Menopausal hormone therapy: NA        OB History     Gravida  3   Para  2   Term  2   Preterm  0   AB  0   Living  2      SAB  0   IAB  0   Ectopic  0   Multiple  0   Live Births  2        Obstetric Comments  FTNSVD            Patient Active Problem List   Diagnosis Date Noted   Gastroesophageal reflux disease 02/05/2018   Hiatal hernia 02/05/2018   Pharyngoesophageal dysphagia 01/21/2018   Uterine fibroids affecting pregnancy in second trimester 11/16/2015   Encounter for supervision of normal first pregnancy in first trimester 10/12/2015   Hx of migraines 09/21/2015   Migraines 01/09/2011   High blood cholesterol level     Past Medical History:  Diagnosis Date   High blood cholesterol level    2012 lab results, tested again with normal levels   Migraines    no aura    Past Surgical History:  Procedure Laterality Date   ORIF RADIAL FRACTURE Right 05/04/2017   Procedure: OPEN REDUCTION INTERNAL FIXATION (ORIF) RADIAL FRACTURE;  Surgeon: Leanora Cover, MD;  Location: [facility];  Service: Orthopedics;  Laterality: Right;  Right radial shaft     Current  Outpatient Medications  Medication Sig Dispense Refill   Magnesium 500 MG TABS      Prenatal Vit-Fe Fumarate-FA (PRENATAL VITAMIN PO) Take by mouth.     VITAMIN D PO Take 2,000 Int'l Units by mouth.     No current facility-administered medications for this visit.     ALLERGIES: Patient has no known allergies.  Family History  Problem Relation Age of Onset   Lymphoma Father    Hyperlipidemia Father    Hypertension Maternal Grandmother     Social History   Socioeconomic History   Marital status: Married    Spouse name: Rachelle Morr   Number of children: Not on file   Years of education: Masters    Highest education level: Not on file  Occupational History   Occupation: Pharmacist, hospital     Comment: [employer]  Tobacco Use   Smoking status: Never   Smokeless tobacco: Never  Substance and Sexual Activity   Alcohol use: Yes    Alcohol/week: 2.0 standard drinks    Types: 2 Standard drinks or equivalent per week   Drug use: No   Sexual activity: Yes  Partners: Male    Birth control/protection: None  Other Topics Concern   Not on file  Social History Narrative   Not on file   Social Determinants of Health   Financial Resource Strain: Not on file  Food Insecurity: Not on file  Transportation Needs: Not on file  Physical Activity: Not on file  Stress: Not on file  Social Connections: Not on file  Intimate Partner Violence: Not on file    Review of Systems  All other systems reviewed and are negative.  PHYSICAL EXAMINATION:    BP 120/74   Pulse 66   Wt 157 lb (71.2 kg)   BMI 26.95 kg/m     General appearance: alert, cooperative and appears stated age Neck: no adenopathy, supple, symmetrical, trachea midline and thyroid normal to inspection and palpation Heart: regular rate and rhythm Lungs: CTAB Abdomen: soft, non-tender; bowel sounds normal; no masses,  no organomegaly. She does have an umbilical hernia, just above her umbilicus.  Extremities: normal,  atraumatic, no cyanosis Skin: normal color, texture and turgor, no rashes or lesions Lymph: normal cervical supraclavicular and inguinal nodes Neurologic: grossly normal   1. Left ovarian cyst, cw dermoid, over 6 cm Surgery: laparoscopic left ovarian cystectomy, possible left oophorectomy.  Risks reviewed, including but not limited to: bleeding, infection, damage to bowel/bladder/vessels/ureters, possible need for laparotomy.  2. Malpositioned intrauterine device (IUD), initial encounter Possible myometrial penetration by one of the arms of the IUD. Will plan hysteroscopy, if penetrating the myometrium will remove the IUD and place a new paragard IUD in the OR She isn't planning on having further children. We discussed the option of bilateral salpingectomies if she is sure she doesn't want to retain her fertility. She will consider and let me know.   3. Umbilical hernia without obstruction and without gangrene General surgery will repair the same day as above surgery

## 2021-05-03 ENCOUNTER — Encounter: Payer: Self-pay | Admitting: Obstetrics and Gynecology

## 2021-05-16 ENCOUNTER — Telehealth: Payer: Self-pay | Admitting: *Deleted

## 2021-05-16 NOTE — Telephone Encounter (Signed)
-----   Message from Salvadore Dom, MD sent at 05/16/2021  8:46 AM EDT ----- Can you please call her and ask her if she has made a decision?  Thanks, Sharee Pimple ----- Message ----- From: Burnice Logan, RN Sent: 05/13/2021  10:15 AM EDT To: Salvadore Dom, MD, Hayley Carder  No specific consent or wait time.  Would you like surgery updated to include possible bilateral salpingectomy?   Currently scheduled: Lap left ovarian cystectomy, possible left oophorectomy, hysteroscopy, possible Paragard IUD exchange.    ----- Message ----- From: Salvadore Dom, MD Sent: 05/03/2021  10:36 AM EDT To: Burnice Logan, RN  This patient is considering having her tubes removed during her laparoscopy in a few weeks. She isn't sure. I assume there isn't any wait time or specific consents needed? Thanks, Sharee Pimple

## 2021-05-16 NOTE — Telephone Encounter (Signed)
Spoke with patient.  Patient is considering options, still undecided. Patient will contact the office before end of day on 05/19/21 with her decision.

## 2021-05-19 ENCOUNTER — Encounter (HOSPITAL_BASED_OUTPATIENT_CLINIC_OR_DEPARTMENT_OTHER): Payer: Self-pay | Admitting: Surgery

## 2021-05-19 ENCOUNTER — Other Ambulatory Visit: Payer: Self-pay

## 2021-05-19 NOTE — Progress Notes (Signed)
Spoke w/ via phone for pre-op interview--- Pt Lab needs dos----   Urine preg            Lab results------ current ekg in epic/ chart COVID test -----patient states asymptomatic no test needed Arrive at ------- 0530 on 05-24-2021 NPO after MN NO Solid Food.  Clear liquids from MN until--- 0430 Med rec completed Medications to take morning of surgery ----- NONE Diabetic medication ----- n/a Patient instructed no nail polish to be worn day of surgery Patient instructed to bring photo id and insurance card day of surgery Patient aware to have Driver (ride ) / caregiver for 24 hours after surgery --husband, [facility] Patient Special Instructions ----- n/a Pre-Op special Istructions -----  both surgeon's have their pre-op orders in epic Patient verbalized understanding of instructions that were given at this phone interview. Patient denies shortness of breath, chest pain, fever, cough at this phone interview.

## 2021-05-23 ENCOUNTER — Telehealth: Payer: Self-pay | Admitting: *Deleted

## 2021-05-23 ENCOUNTER — Ambulatory Visit: Payer: 59 | Admitting: Obstetrics and Gynecology

## 2021-05-23 ENCOUNTER — Other Ambulatory Visit: Payer: Self-pay

## 2021-05-23 ENCOUNTER — Encounter: Payer: Self-pay | Admitting: Obstetrics and Gynecology

## 2021-05-23 VITALS — BP 132/72 | HR 88 | Temp 98.8°F | Wt 155.0 lb

## 2021-05-23 DIAGNOSIS — N61 Mastitis without abscess: Secondary | ICD-10-CM

## 2021-05-23 MED ORDER — SULFAMETHOXAZOLE-TRIMETHOPRIM 800-160 MG PO TABS
1.0000 | ORAL_TABLET | Freq: Two times a day (BID) | ORAL | 0 refills | Status: DC
Start: 2021-05-23 — End: 2021-07-14

## 2021-05-23 NOTE — Progress Notes (Signed)
GYNECOLOGY  VISIT   HPI: 35 y.o.   Married White or Caucasian Not Hispanic or Latino  female   [phone] with Abigail Hughes's last menstrual period was 04/29/2021 (exact date).   here for mastitis or clogged milk duct. Abigail Hughes states that she had streaking yesterday and today she is feeling better and has not had fever.  Her son has been nursing less and less, she has been engorged in am. Yesterday she had focal pain in her left breast, some streaking yesterday, some body aches yesterday. No fevers. Yesterday she pumped a lot, feeling better today. Still sore, the streaking is less today.   GYNECOLOGIC HISTORY: Abigail Hughes's last menstrual period was 04/29/2021 (exact date). Contraception:IUD inserted 12/31/20  Menopausal hormone therapy: none         OB History     Gravida  3   Para  2   Term  2   Preterm  0   AB  0   Living  2      SAB  0   IAB  0   Ectopic  0   Multiple  0   Live Births  2        Obstetric Comments  FTNSVD            Abigail Hughes Active Problem List   Diagnosis Date Noted   Gastroesophageal reflux disease 02/05/2018   Hiatal hernia 02/05/2018   Pharyngoesophageal dysphagia 01/21/2018   Uterine fibroids affecting pregnancy in second trimester 11/16/2015   Encounter for supervision of normal first pregnancy in first trimester 10/12/2015   Hx of migraines 09/21/2015   Migraines 01/09/2011   High blood cholesterol level     Past Medical History:  Diagnosis Date   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 Harrell Gave, office note in epic 05-26-2020, palpitations and sob gestational preg 21 wks,  no further work-up and pt told no issue since   Hx of migraines 2012   no aura   Malpositioned  IUD    Umbilical hernia     Past Surgical History:  Procedure Laterality Date   ORIF RADIAL FRACTURE Right 05/04/2017   Procedure: OPEN REDUCTION INTERNAL FIXATION (ORIF) RADIAL FRACTURE;  Surgeon: Leanora Cover, MD;  Location: [facility];  Service: Orthopedics;  Laterality: Right;  Right radial shaft    WISDOM TOOTH EXTRACTION     teen    Current Outpatient Medications  Medication Sig Dispense Refill   acetaminophen (TYLENOL) 500 MG tablet Take 1,000 mg by mouth every 6 (six) hours as needed.     calcium carbonate (TUMS - DOSED IN MG ELEMENTAL CALCIUM) 500 MG chewable tablet Chew 1 tablet by mouth as needed for indigestion or heartburn.     Cholecalciferol (VITAMIN D3) 75 MCG (3000 UT) TABS Take 1 tablet by mouth daily.     Ibuprofen (ADVIL) 200 MG CAPS Take by mouth as needed.     Magnesium 250 MG TABS Take 2 tablets by mouth daily.     PARAGARD INTRAUTERINE COPPER IU by Intrauterine route once.     Prenatal Vit-Fe Fumarate-FA (PRENATAL VITAMIN PO) Take 1 tablet by mouth daily.     No current facility-administered medications for  this visit.     ALLERGIES: Abigail Hughes has no known allergies.  Family History  Problem Relation Age of Onset   Lymphoma Father    Hyperlipidemia Father    Hypertension Maternal Grandmother     Social History   Socioeconomic History   Marital status: Married    Spouse name: Cherylin Timmermans   Number of children: Not on file   Years of education: Masters    Highest education level: Not on file  Occupational History   Occupation: Pharmacist, hospital     Comment: [employer]  Tobacco Use   Smoking status: Never   Smokeless tobacco: Never  Scientific laboratory technician Use: Never used  Substance and Sexual Activity   Alcohol use: Not Currently    Alcohol/week: 3.0 standard drinks    Types: 3 Standard drinks or equivalent per week   Drug use: Never   Sexual activity: Yes    Partners: Male    Birth control/protection: I.U.D.  Other Topics  Concern   Not on file  Social History Narrative   Not on file   Social Determinants of Health   Financial Resource Strain: Not on file  Food Insecurity: Not on file  Transportation Needs: Not on file  Physical Activity: Not on file  Stress: Not on file  Social Connections: Not on file  Intimate Partner Violence: Not on file    Review of Systems  All other systems reviewed and are negative.  PHYSICAL EXAMINATION:    LMP 04/29/2021 (Exact Date)     General appearance: alert, cooperative and appears stated age Breasts:  mild erythema and tenderness in the left breast between 7-9 o'clock just outside the areolar region, no lumps No axillary adenopathy  1. Mastitis, left, acute Continue to nurse or pump, can use heat or ice - sulfamethoxazole-trimethoprim (BACTRIM DS) 800-160 MG tablet; Take 1 tablet by mouth 2 (two) times daily. One PO BID x 3 days  Dispense: 14 tablet; Refill: 0 -Will reschedule her surgery.  She has decided to have her IUD removed at the time of her surgery and doesn't want her tubes removed.  -Can use tylenol or ibuprofen for pain

## 2021-05-23 NOTE — Patient Instructions (Signed)
Mastitis  Mastitis is inflammation of the breast tissue. It occurs most often in women who are breastfeeding, but it can also affect other women, and sometimes evenmen. What are the causes? This condition is usually caused by a bacterial infection. Bacteria can enter the breast tissue through cuts or openings in the skin. This usually occurs with breastfeeding because of cracked or irritated nipples. Sometimes, mastitis can occur when there are no cuts or openings in the skin. This is usuallycaused by plugged milk ducts. Other causes include: Nipple piercing. Some forms of breast cancer. What are the signs or symptoms? Symptoms of this condition include: Swelling, redness, tenderness, and pain in an area of the breast. The area may also feel warm to the touch. These symptoms usually affect the upper part of the breast, toward the armpit region. Swelling of the glands under the arm on the same side. Discharge from the nipple. Fever. Chills. Nausea and vomiting. Rapid pulse. Fatigue, headache, and flu-like muscle aches. If an infection is not treated, a collection of pus, or an abscess, may developon the breast. How is this diagnosed? This condition can usually be diagnosed based on a physical exam and your symptoms. You may also have other tests, such as: Blood tests to check if your body is fighting an infection from bacteria. Mammogram or ultrasound tests to rule out other problems or diseases. Testing of pus and other fluids. Pus from the breast may be collected and tested in the lab. If an abscess has developed, the fluid in the abscess can be removed with a needle. This test can be used to confirm the diagnosis and identify the type of bacteria that is causing your mastitis. Culturing and testing breast milk for bacteria. This is done only if you are breastfeeding. How is this treated? Treatment for this condition may include: Applying heat or cold compresses to the affected  area. Medicine for pain. Antibiotic medicine to treat an infection from bacteria. This is usually taken by mouth. Self-care, including rest and drinking more fluid. Removing fluid with a needle, if an abscess has developed. Mastitis that occurs with breastfeeding will sometimes go away on its own, so your health care provider may choose to wait 24 hours after first seeing you to decide whether a prescription medicine is needed. You may be told of different ways to help manage breastfeeding, such as continuing to breastfeed or pump inorder to ensure adequate milk flow. Follow these instructions at home: If you are breastfeeding:  Continue to empty your breasts as often as possible. You can empty your breasts by breastfeeding or by using a breast pump. This will decrease the pressure and the pain that comes with full breasts. Ask your health care provider if you should make changes to your breastfeeding or pumping routine. During breastfeeding, empty the first breast completely before going to the other breast. If your baby is not emptying your breasts completely, use a breast pump to empty your breasts. Keep your nipples clean and dry. Use breast massage during feeding or pumping sessions. If directed, apply moist heat to the affected area of your breast right before breastfeeding or pumping. Use the heat source that your health care provider recommends. If directed, put ice on the affected area of your breast right after breastfeeding or pumping. To do this: Put ice in a plastic bag. Place a towel between your skin and the bag. Leave the ice on for 20 minutes. Remove the ice if your skin turns bright red.  This is very important. If you cannot feel pain, heat, or cold, you have a greater risk of damage to the area. If you go back to work, pump your breasts while at work to stay within your nursing schedule. Avoid allowing your breasts to become very full with milk (engorged).  Medicines Take  over-the-counter and prescription medicines only as told by your health care provider. If you were prescribed an antibiotic medicine, take it as told by your health care provider. Do not stop taking the antibiotic even if you start to feel better. General instructions Do not wear a tight or underwire bra. Wear a soft, supportive bra. Drink enough fluid to keep your urine pale yellow. This is especially important if you have a fever. Get plenty of rest. Keep all follow-up visits. This is important. Contact a health care provider if: You have pus-like discharge from the breast. You have a fever. Your symptoms do not improve within 2 days of starting treatment. Get help right away if: Your pain and swelling are getting worse. You have pain that is not controlled with medicine. You have a red line extending from the breast toward your armpit. Summary Mastitis is inflammation of the breast tissue. It occurs most often in women who are breastfeeding, but it can also affect non-breastfeeding women and some men. This condition is usually caused by a bacterial infection. This condition may be treated with hot and cold compresses, medicines, self-care, and certain breastfeeding strategies. If you were prescribed an antibiotic medicine, take it as told by your health care provider. Do not stop taking the antibiotic even if you start to feel better. This information is not intended to replace advice given to you by your health care provider. Make sure you discuss any questions you have with your healthcare provider. Document Revised: 07/26/2020 Document Reviewed: 07/26/2020 Elsevier Patient Education  2022 [employer].

## 2021-05-23 NOTE — Telephone Encounter (Signed)
Call placed to [facility], spoke with Maudie Mercury. Advised combined case for 8/16 with Dr. Talbert Nan to be cancelled. Patient seen in office today and tx for mastitis. Maudie Mercury will forward to scheduler for return call.    Call placed to [facility], spoke with Colletta Maryland, case cancelled for 05/24/21.

## 2021-05-23 NOTE — Telephone Encounter (Signed)
Spoke with patient.    1. Patient request to proceed with Laparoscopic left ovarian cystectomy, possible left oophorectomy, hysteroscopy and IUD removal on 8/16.   2. Patient reports scant amount of bloody discharge from left breast, symptoms started on 8/15. Body aches on 8/14, aches have resolved. No fever. Breast is tender. OV scheduled for today at 2:15pm with Dr. Talbert Nan. Patient is aware this is a work in appt. Advised I will forward message to Dr. Talbert Nan and f/u if any additional recommendations. Patient agreeable.   Call placed to [facility], spoke with Maudie Mercury. Case updated.   Routing to Dr. Talbert Nan  Cc: Angie Fava Carder

## 2021-05-24 ENCOUNTER — Ambulatory Visit (HOSPITAL_BASED_OUTPATIENT_CLINIC_OR_DEPARTMENT_OTHER): Admission: RE | Admit: 2021-05-24 | Payer: 59 | Source: Home / Self Care | Admitting: Surgery

## 2021-05-24 HISTORY — DX: Headache, unspecified: R51.9

## 2021-05-24 HISTORY — DX: Personal history of other specified conditions: Z87.898

## 2021-05-24 HISTORY — DX: Umbilical hernia without obstruction or gangrene: K42.9

## 2021-05-24 HISTORY — DX: Gastro-esophageal reflux disease without esophagitis: K21.9

## 2021-05-24 HISTORY — DX: Displacement of intrauterine contraceptive device, initial encounter: T83.32XA

## 2021-05-24 HISTORY — DX: Diaphragmatic hernia without obstruction or gangrene: K44.9

## 2021-05-24 SURGERY — REPAIR, HERNIA, UMBILICAL, LAPAROSCOPIC
Anesthesia: General

## 2021-05-30 NOTE — Telephone Encounter (Signed)
Call placed to [facility], left message for Caryl Pina to return call to [place], [place] at [phone] in regard to r/s surgery.   Call to patient, Left message to call Sharee Pimple, RN at [facility], [phone].

## 2021-06-06 ENCOUNTER — Encounter: Payer: 59 | Admitting: Obstetrics and Gynecology

## 2021-06-06 NOTE — Telephone Encounter (Signed)
Call placed to [facility]/ Caryl Pina, Left message to call Sharee Pimple, RN at [place], [phone].  Call placed to patient. Left message to call Sharee Pimple, RN at [place], [phone].

## 2021-06-06 NOTE — Telephone Encounter (Signed)
See open telephone encounter dated 05/23/21.   Encounter closed.

## 2021-06-06 NOTE — Telephone Encounter (Signed)
Spoke with Caryl Pina at [facility]. Reviewed surgery dates. She will review availability with Dr. Thermon Leyland and return call.

## 2021-06-07 NOTE — Telephone Encounter (Signed)
Call placed to patient. Left detailed message to call Sharee Pimple, RN at [place], [phone]. Surgery rescheduled to 08/02/21 at 0730 at [facility]. Please return call to confirm.  Routing to [employer].

## 2021-06-07 NOTE — Telephone Encounter (Signed)
Spoke with Caryl Pina at [employer].  Available 08/02/21 for combined case.  Dr. Talbert Nan will start at 0730, Dr. Lanny Hurst at 0900.   Advised I will call OR to schedule and contact the patient to confirm. Once confirmed, I will return call to [facility].   Call placed to [facility] Scheduling, spoke with Myriam Jacobson. Case rescheduled to 08/02/21 at 0730.

## 2021-06-09 NOTE — Telephone Encounter (Signed)
Spoke with patient. Surgery date request confirmed.  Advised surgery is rescheduled for 08/02/21, [facility] at 60.  Surgery instruction sheet and hospital brochure reviewed, patient has already received copy.  Patient advised if Covid screening and quarantine requirements and agreeable.  Pre-op scheduled for 10/6 at 1630.  Patient verbalizes understanding and is agreeable.     Call placed to [place] at [place]. Left message advising as seen above. Return call to office if any additional questions. Case number provided.   Routing to [employer] for benefits.

## 2021-06-14 NOTE — Telephone Encounter (Signed)
Patient is aware of surgery benefits from previously scheduled surgery.   Encounter closed.

## 2021-07-13 ENCOUNTER — Ambulatory Visit: Payer: Self-pay | Admitting: Surgery

## 2021-07-14 ENCOUNTER — Encounter: Payer: Self-pay | Admitting: Obstetrics and Gynecology

## 2021-07-14 ENCOUNTER — Other Ambulatory Visit: Payer: Self-pay

## 2021-07-14 ENCOUNTER — Ambulatory Visit (INDEPENDENT_AMBULATORY_CARE_PROVIDER_SITE_OTHER): Payer: 59 | Admitting: Obstetrics and Gynecology

## 2021-07-14 VITALS — BP 130/80 | HR 88 | Ht 64.0 in | Wt 155.0 lb

## 2021-07-14 DIAGNOSIS — Z01818 Encounter for other preprocedural examination: Secondary | ICD-10-CM | POA: Diagnosis not present

## 2021-07-14 DIAGNOSIS — N83202 Unspecified ovarian cyst, left side: Secondary | ICD-10-CM

## 2021-07-14 DIAGNOSIS — K429 Umbilical hernia without obstruction or gangrene: Secondary | ICD-10-CM | POA: Diagnosis not present

## 2021-07-14 DIAGNOSIS — Z3009 Encounter for other general counseling and advice on contraception: Secondary | ICD-10-CM

## 2021-07-14 NOTE — H&P (View-Only) (Signed)
GYNECOLOGY  VISIT   HPI: 35 y.o.   Married White or Caucasian Not Hispanic or Latino  female   [phone] with Patient's last menstrual period was 07/07/2021.   here for  a preoperative visit.  She has a persistent, complelx left ovarian cyst c/w a dermoid. U/S from 4/22 the cyst measured a maximum diameter of 6.72 cm. At the time of the ultrasound one of the arms of her paragard IUD was thought to possibly be penetrating the myometrium. She would just like the IUD removed, will use condoms.   She also has an umbilical hernia.  She was originally scheduled for surgery in 8/22, it was canceled secondary to her developing mastitis.   GYNECOLOGIC HISTORY: Patient's last menstrual period was 07/07/2021. Contraception:IIUD Menopausal hormone therapy: none         OB History     Gravida  3   Para  2   Term  2   Preterm  0   AB  0   Living  2      SAB  0   IAB  0   Ectopic  0   Multiple  0   Live Births  2        Obstetric Comments  FTNSVD            Patient Active Problem List   Diagnosis Date Noted   Gastroesophageal reflux disease 02/05/2018   Hiatal hernia 02/05/2018   Pharyngoesophageal dysphagia 01/21/2018   Uterine fibroids affecting pregnancy in second trimester 11/16/2015   Encounter for supervision of normal first pregnancy in first trimester 10/12/2015   Hx of migraines 09/21/2015   Migraines 01/09/2011   High blood cholesterol level     Past Medical History:  Diagnosis Date   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 Harrell Gave, office note in epic 05-26-2020, palpitations and sob gestational preg 21 wks,  no further work-up and pt told no issue since   Hx of migraines 2012   no  aura   Malpositioned IUD    Umbilical hernia     Past Surgical History:  Procedure Laterality Date   ORIF RADIAL FRACTURE Right 05/04/2017   Procedure: OPEN REDUCTION INTERNAL FIXATION (ORIF) RADIAL FRACTURE;  Surgeon: Leanora Cover, MD;  Location: [facility];  Service: Orthopedics;  Laterality: Right;  Right radial shaft    WISDOM TOOTH EXTRACTION     teen    Current Outpatient Medications  Medication Sig Dispense Refill   Cholecalciferol (VITAMIN D3) 75 MCG (3000 UT) TABS Take 1 tablet by mouth daily.     Magnesium 250 MG TABS Take 2 tablets by mouth daily.     PARAGARD INTRAUTERINE COPPER IU by Intrauterine route once.     Prenatal Vit-Fe Fumarate-FA (PRENATAL VITAMIN PO) Take 1 tablet by mouth daily.     No current facility-administered medications for this visit.     ALLERGIES: Patient has no known allergies.  Family History  Problem Relation Age of Onset   Lymphoma Father    Hyperlipidemia Father    Hypertension Maternal Grandmother     Social History   Socioeconomic History   Marital status: Married    Spouse  name: Janese Radabaugh   Number of children: Not on file   Years of education: Masters    Highest education level: Not on file  Occupational History   Occupation: Pharmacist, hospital     Comment: [employer]  Tobacco Use   Smoking status: Never   Smokeless tobacco: Never  Vaping Use   Vaping Use: Never used  Substance and Sexual Activity   Alcohol use: Not Currently    Alcohol/week: 3.0 standard drinks    Types: 3 Standard drinks or equivalent per week   Drug use: Never   Sexual activity: Yes    Partners: Male    Birth control/protection: I.U.D.  Other Topics Concern   Not on file  Social History Narrative   Not on file   Social Determinants of Health   Financial Resource Strain: Not on file  Food Insecurity: Not on file  Transportation Needs: Not on file  Physical Activity: Not on file  Stress: Not on file  Social Connections:  Not on file  Intimate Partner Violence: Not on file    Review of Systems  All other systems reviewed and are negative.  PHYSICAL EXAMINATION:    BP 130/80   Pulse 88   Ht 5\' 4"  (1.626 m)   Wt 155 lb (70.3 kg)   LMP 07/07/2021   SpO2 99%   BMI 26.61 kg/m     General appearance: alert, cooperative and appears stated age Neck: no adenopathy, supple, symmetrical, trachea midline and thyroid normal to inspection and palpation Heart: regular rate and rhythm Lungs: CTAB Abdomen: soft, non-tender; bowel sounds normal; no masses,  no organomegaly. She does have an umbilical hernia, just above her umbilicus, reducible.  Extremities: normal, atraumatic, no cyanosis Skin: normal color, texture and turgor, no rashes or lesions Lymph: normal cervical supraclavicular and inguinal nodes Neurologic: grossly normal   1. Left ovarian cyst, c/w dermoid, over 6 cm Plan: laparoscopic left ovarian cystectomy, possible left oophorectomy.  Risks reviewed, including but not limited to: bleeding, infection, damage to bowel/bladder/vessels/ureters, possible need for laparotomy.lan:   2. General counseling and advice on female contraception Desires IUD removal, will remove in the OR  3. Umbilical hernia without obstruction and without gangrene General Surgeon will repair this the same day as above surgery.

## 2021-07-14 NOTE — Progress Notes (Signed)
GYNECOLOGY  VISIT   HPI: 35 y.o.   Married White or Caucasian Not Hispanic or Latino  female   [phone] with Patient's last menstrual period was 07/07/2021.   here for  a preoperative visit.  She has a persistent, complelx left ovarian cyst c/w a dermoid. U/S from 4/22 the cyst measured a maximum diameter of 6.72 cm. At the time of the ultrasound one of the arms of her paragard IUD was thought to possibly be penetrating the myometrium. She would just like the IUD removed, will use condoms.   She also has an umbilical hernia.  She was originally scheduled for surgery in 8/22, it was canceled secondary to her developing mastitis.   GYNECOLOGIC HISTORY: Patient's last menstrual period was 07/07/2021. Contraception:IIUD Menopausal hormone therapy: none         OB History     Gravida  3   Para  2   Term  2   Preterm  0   AB  0   Living  2      SAB  0   IAB  0   Ectopic  0   Multiple  0   Live Births  2        Obstetric Comments  FTNSVD            Patient Active Problem List   Diagnosis Date Noted   Gastroesophageal reflux disease 02/05/2018   Hiatal hernia 02/05/2018   Pharyngoesophageal dysphagia 01/21/2018   Uterine fibroids affecting pregnancy in second trimester 11/16/2015   Encounter for supervision of normal first pregnancy in first trimester 10/12/2015   Hx of migraines 09/21/2015   Migraines 01/09/2011   High blood cholesterol level     Past Medical History:  Diagnosis Date   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 Harrell Gave, office note in epic 05-26-2020, palpitations and sob gestational preg 21 wks,  no further work-up and pt told no issue since   Hx of migraines 2012   no  aura   Malpositioned IUD    Umbilical hernia     Past Surgical History:  Procedure Laterality Date   ORIF RADIAL FRACTURE Right 05/04/2017   Procedure: OPEN REDUCTION INTERNAL FIXATION (ORIF) RADIAL FRACTURE;  Surgeon: Leanora Cover, MD;  Location: [facility];  Service: Orthopedics;  Laterality: Right;  Right radial shaft    WISDOM TOOTH EXTRACTION     teen    Current Outpatient Medications  Medication Sig Dispense Refill   Cholecalciferol (VITAMIN D3) 75 MCG (3000 UT) TABS Take 1 tablet by mouth daily.     Magnesium 250 MG TABS Take 2 tablets by mouth daily.     PARAGARD INTRAUTERINE COPPER IU by Intrauterine route once.     Prenatal Vit-Fe Fumarate-FA (PRENATAL VITAMIN PO) Take 1 tablet by mouth daily.     No current facility-administered medications for this visit.     ALLERGIES: Patient has no known allergies.  Family History  Problem Relation Age of Onset   Lymphoma Father    Hyperlipidemia Father    Hypertension Maternal Grandmother     Social History   Socioeconomic History   Marital status: Married    Spouse  name: Kerrianne Jeng   Number of children: Not on file   Years of education: Masters    Highest education level: Not on file  Occupational History   Occupation: Pharmacist, hospital     Comment: [employer]  Tobacco Use   Smoking status: Never   Smokeless tobacco: Never  Vaping Use   Vaping Use: Never used  Substance and Sexual Activity   Alcohol use: Not Currently    Alcohol/week: 3.0 standard drinks    Types: 3 Standard drinks or equivalent per week   Drug use: Never   Sexual activity: Yes    Partners: Male    Birth control/protection: I.U.D.  Other Topics Concern   Not on file  Social History Narrative   Not on file   Social Determinants of Health   Financial Resource Strain: Not on file  Food Insecurity: Not on file  Transportation Needs: Not on file  Physical Activity: Not on file  Stress: Not on file  Social Connections:  Not on file  Intimate Partner Violence: Not on file    Review of Systems  All other systems reviewed and are negative.  PHYSICAL EXAMINATION:    BP 130/80   Pulse 88   Ht 5\' 4"  (1.626 m)   Wt 155 lb (70.3 kg)   LMP 07/07/2021   SpO2 99%   BMI 26.61 kg/m     General appearance: alert, cooperative and appears stated age Neck: no adenopathy, supple, symmetrical, trachea midline and thyroid normal to inspection and palpation Heart: regular rate and rhythm Lungs: CTAB Abdomen: soft, non-tender; bowel sounds normal; no masses,  no organomegaly. She does have an umbilical hernia, just above her umbilicus, reducible.  Extremities: normal, atraumatic, no cyanosis Skin: normal color, texture and turgor, no rashes or lesions Lymph: normal cervical supraclavicular and inguinal nodes Neurologic: grossly normal   1. Left ovarian cyst, c/w dermoid, over 6 cm Plan: laparoscopic left ovarian cystectomy, possible left oophorectomy.  Risks reviewed, including but not limited to: bleeding, infection, damage to bowel/bladder/vessels/ureters, possible need for laparotomy.lan:   2. General counseling and advice on female contraception Desires IUD removal, will remove in the OR  3. Umbilical hernia without obstruction and without gangrene General Surgeon will repair this the same day as above surgery.

## 2021-07-26 ENCOUNTER — Other Ambulatory Visit: Payer: Self-pay | Admitting: Obstetrics & Gynecology

## 2021-07-26 MED ORDER — FLUCONAZOLE 150 MG PO TABS: 150.0000 mg | ORAL_TABLET | Freq: Every day | ORAL | 1 refills | Status: AC

## 2021-07-28 ENCOUNTER — Other Ambulatory Visit: Payer: Self-pay

## 2021-07-28 ENCOUNTER — Encounter (HOSPITAL_BASED_OUTPATIENT_CLINIC_OR_DEPARTMENT_OTHER): Payer: Self-pay | Admitting: Surgery

## 2021-07-28 NOTE — Progress Notes (Signed)
Spoke w/ via phone for pre-op interview---patient Lab needs dos---- urine pregnancy              Lab results------none, 06/17/2020 EKG in chart COVID test -----patient states asymptomatic no test needed Arrive at -------0530 NPO after MN NO Solid Food.  Clear liquids from MN until---0430 Med rec completed Medications to take morning of surgery -----none Diabetic medication -----n/a Patient instructed no nail polish to be worn day of surgery Patient instructed to bring photo id and insurance card day of surgery Patient aware to have Driver (ride ) / caregiver    for 24 hours after surgery - husband [facility] Patient Special Instructions -----none Pre-Op special Istructions -----none Patient verbalized understanding of instructions that were given at this phone interview. Patient denies shortness of breath, chest pain, fever, cough at this phone interview.

## 2021-08-01 NOTE — Anesthesia Preprocedure Evaluation (Addendum)
Anesthesia Evaluation  Patient identified by MRN, date of birth, ID band Patient awake    Reviewed: Allergy & Precautions, NPO status , Patient's Chart, lab work & pertinent test results  History of Anesthesia Complications Negative for: history of anesthetic complications  Airway Mallampati: II  TM Distance: >3 FB Neck ROM: Full    Dental no notable dental hx.    Pulmonary neg pulmonary ROS,    Pulmonary exam normal        Cardiovascular negative cardio ROS Normal cardiovascular exam     Neuro/Psych  Headaches, negative psych ROS   GI/Hepatic Neg liver ROS, hiatal hernia, GERD  Controlled,  Endo/Other  negative endocrine ROS  Renal/GU negative Renal ROS  negative genitourinary   Musculoskeletal negative musculoskeletal ROS (+)   Abdominal   Peds  Hematology negative hematology ROS (+)   Anesthesia Other Findings umbilical hernia, left ovarian dermoid, possible malpositioned iud  Reproductive/Obstetrics                            Anesthesia Physical Anesthesia Plan  ASA: 2  Anesthesia Plan: General   Post-op Pain Management:    Induction: Intravenous  PONV Risk Score and Plan: 4 or greater and Treatment may vary due to age or medical condition, Ondansetron, Dexamethasone, Midazolam, Scopolamine patch - Pre-op and Propofol infusion  Airway Management Planned: Oral ETT  Additional Equipment: None  Intra-op Plan:   Post-operative Plan: Extubation in OR  Informed Consent: I have reviewed the patients History and Physical, chart, labs and discussed the procedure including the risks, benefits and alternatives for the proposed anesthesia with the patient or authorized representative who has indicated his/her understanding and acceptance.     Dental advisory given  Plan Discussed with: CRNA  Anesthesia Plan Comments:        Anesthesia Quick Evaluation

## 2021-08-02 ENCOUNTER — Ambulatory Visit (HOSPITAL_BASED_OUTPATIENT_CLINIC_OR_DEPARTMENT_OTHER)
Admission: RE | Admit: 2021-08-02 | Discharge: 2021-08-02 | Disposition: A | Discharge status: Home / Self Care | Payer: 59 | Attending: Surgery | Admitting: Surgery

## 2021-08-02 ENCOUNTER — Other Ambulatory Visit: Payer: Self-pay

## 2021-08-02 ENCOUNTER — Other Ambulatory Visit: Payer: Self-pay | Admitting: Obstetrics and Gynecology

## 2021-08-02 ENCOUNTER — Encounter (HOSPITAL_BASED_OUTPATIENT_CLINIC_OR_DEPARTMENT_OTHER): Payer: Self-pay | Admitting: Surgery

## 2021-08-02 ENCOUNTER — Encounter (HOSPITAL_BASED_OUTPATIENT_CLINIC_OR_DEPARTMENT_OTHER)
Admission: RE | Disposition: A | Discharge status: Home / Self Care | Payer: Self-pay | Source: Home / Self Care | Attending: Surgery

## 2021-08-02 ENCOUNTER — Ambulatory Visit (HOSPITAL_BASED_OUTPATIENT_CLINIC_OR_DEPARTMENT_OTHER): Payer: 59 | Admitting: Anesthesiology

## 2021-08-02 DIAGNOSIS — K429 Umbilical hernia without obstruction or gangrene: Secondary | ICD-10-CM | POA: Insufficient documentation

## 2021-08-02 DIAGNOSIS — Z30432 Encounter for removal of intrauterine contraceptive device: Secondary | ICD-10-CM | POA: Insufficient documentation

## 2021-08-02 DIAGNOSIS — Z8616 Personal history of COVID-19: Secondary | ICD-10-CM | POA: Insufficient documentation

## 2021-08-02 DIAGNOSIS — D271 Benign neoplasm of left ovary: Secondary | ICD-10-CM | POA: Insufficient documentation

## 2021-08-02 DIAGNOSIS — D259 Leiomyoma of uterus, unspecified: Secondary | ICD-10-CM | POA: Insufficient documentation

## 2021-08-02 DIAGNOSIS — N83202 Unspecified ovarian cyst, left side: Secondary | ICD-10-CM | POA: Diagnosis not present

## 2021-08-02 DIAGNOSIS — T8332XA Displacement of intrauterine contraceptive device, initial encounter: Secondary | ICD-10-CM | POA: Diagnosis not present

## 2021-08-02 HISTORY — PX: IUD REMOVAL: SHX5392

## 2021-08-02 HISTORY — PX: OOPHORECTOMY: SHX6387

## 2021-08-02 HISTORY — PX: UMBILICAL HERNIA REPAIR: SHX196

## 2021-08-02 LAB — POCT PREGNANCY, URINE: Preg Test, Ur: NEGATIVE

## 2021-08-02 SURGERY — REPAIR, HERNIA, UMBILICAL, LAPAROSCOPIC
Anesthesia: General | Site: Abdomen

## 2021-08-02 MED ORDER — FENTANYL CITRATE (PF) 100 MCG/2ML IJ SOLN
INTRAMUSCULAR | Status: AC
  Filled 2021-08-02: qty 2

## 2021-08-02 MED ORDER — PROPOFOL 10 MG/ML IV BOLUS
INTRAVENOUS | Status: DC | PRN
  Administered 2021-08-02: 140 mg via INTRAVENOUS

## 2021-08-02 MED ORDER — LACTATED RINGERS IV SOLN: INTRAVENOUS | Status: DC

## 2021-08-02 MED ORDER — LIDOCAINE 2% (20 MG/ML) 5 ML SYRINGE
INTRAMUSCULAR | Status: DC | PRN
  Administered 2021-08-02: 100 mg via INTRAVENOUS

## 2021-08-02 MED ORDER — 0.9 % SODIUM CHLORIDE (POUR BTL) OPTIME
TOPICAL | Status: DC | PRN
  Administered 2021-08-02: 500 mL

## 2021-08-02 MED ORDER — CEFAZOLIN SODIUM-DEXTROSE 2-4 GM/100ML-% IV SOLN
2.0000 g | INTRAVENOUS | Status: AC
  Administered 2021-08-02: 2 g via INTRAVENOUS

## 2021-08-02 MED ORDER — OXYCODONE HCL 5 MG/5ML PO SOLN: 5.0000 mg | Freq: Once | ORAL | Status: AC | PRN

## 2021-08-02 MED ORDER — MIDAZOLAM HCL 2 MG/2ML IJ SOLN
INTRAMUSCULAR | Status: DC | PRN
  Administered 2021-08-02: 2 mg via INTRAVENOUS

## 2021-08-02 MED ORDER — CHLORHEXIDINE GLUCONATE CLOTH 2 % EX PADS: 6.0000 | MEDICATED_PAD | Freq: Once | CUTANEOUS | Status: DC

## 2021-08-02 MED ORDER — FENTANYL CITRATE (PF) 100 MCG/2ML IJ SOLN
25.0000 ug | INTRAMUSCULAR | Status: DC | PRN
  Administered 2021-08-02 (×3): 25 ug via INTRAVENOUS

## 2021-08-02 MED ORDER — OXYCODONE HCL 5 MG PO TABS: 5.0000 mg | ORAL_TABLET | ORAL | 0 refills | Status: DC | PRN

## 2021-08-02 MED ORDER — KETOROLAC TROMETHAMINE 30 MG/ML IJ SOLN
INTRAMUSCULAR | Status: AC
  Filled 2021-08-02: qty 1

## 2021-08-02 MED ORDER — ONDANSETRON HCL 4 MG/2ML IJ SOLN
INTRAMUSCULAR | Status: AC
  Filled 2021-08-02: qty 2

## 2021-08-02 MED ORDER — IBUPROFEN 800 MG PO TABS: 800.0000 mg | ORAL_TABLET | Freq: Three times a day (TID) | ORAL | 0 refills | Status: DC | PRN

## 2021-08-02 MED ORDER — MIDAZOLAM HCL 2 MG/2ML IJ SOLN
INTRAMUSCULAR | Status: AC
  Filled 2021-08-02: qty 2

## 2021-08-02 MED ORDER — FENTANYL CITRATE (PF) 100 MCG/2ML IJ SOLN
INTRAMUSCULAR | Status: DC | PRN
  Administered 2021-08-02 (×2): 50 ug via INTRAVENOUS
  Administered 2021-08-02 (×2): 25 ug via INTRAVENOUS
  Administered 2021-08-02: 50 ug via INTRAVENOUS

## 2021-08-02 MED ORDER — LACTATED RINGERS IV SOLN
INTRAVENOUS | Status: DC
  Administered 2021-08-02: 1000 mL via INTRAVENOUS

## 2021-08-02 MED ORDER — SCOPOLAMINE 1 MG/3DAYS TD PT72
MEDICATED_PATCH | TRANSDERMAL | Status: AC
  Filled 2021-08-02: qty 1

## 2021-08-02 MED ORDER — CEFAZOLIN SODIUM-DEXTROSE 2-4 GM/100ML-% IV SOLN
INTRAVENOUS | Status: AC
  Filled 2021-08-02: qty 100

## 2021-08-02 MED ORDER — ROCURONIUM BROMIDE 10 MG/ML (PF) SYRINGE
PREFILLED_SYRINGE | INTRAVENOUS | Status: DC | PRN
  Administered 2021-08-02: 50 mg via INTRAVENOUS
  Administered 2021-08-02 (×2): 20 mg via INTRAVENOUS

## 2021-08-02 MED ORDER — SODIUM CHLORIDE 0.9 % IR SOLN
Status: DC | PRN
  Administered 2021-08-02 (×3): 1000 mL

## 2021-08-02 MED ORDER — LIDOCAINE 2% (20 MG/ML) 5 ML SYRINGE
INTRAMUSCULAR | Status: AC
  Filled 2021-08-02: qty 5

## 2021-08-02 MED ORDER — DEXAMETHASONE SODIUM PHOSPHATE 10 MG/ML IJ SOLN
INTRAMUSCULAR | Status: AC
  Filled 2021-08-02: qty 1

## 2021-08-02 MED ORDER — PROPOFOL 10 MG/ML IV BOLUS
INTRAVENOUS | Status: AC
  Filled 2021-08-02: qty 20

## 2021-08-02 MED ORDER — BUPIVACAINE HCL 0.5 % IJ SOLN
INTRAMUSCULAR | Status: DC | PRN
  Administered 2021-08-02: 6 mL

## 2021-08-02 MED ORDER — ACETAMINOPHEN 500 MG PO TABS
ORAL_TABLET | ORAL | Status: AC
  Filled 2021-08-02: qty 1

## 2021-08-02 MED ORDER — PROPOFOL 1000 MG/100ML IV EMUL
INTRAVENOUS | Status: AC
  Filled 2021-08-02: qty 100

## 2021-08-02 MED ORDER — ACETAMINOPHEN 500 MG PO TABS
ORAL_TABLET | ORAL | Status: AC
  Filled 2021-08-02: qty 2

## 2021-08-02 MED ORDER — ROCURONIUM BROMIDE 10 MG/ML (PF) SYRINGE
PREFILLED_SYRINGE | INTRAVENOUS | Status: AC
  Filled 2021-08-02: qty 10

## 2021-08-02 MED ORDER — KETOROLAC TROMETHAMINE 30 MG/ML IJ SOLN
INTRAMUSCULAR | Status: DC | PRN
  Administered 2021-08-02: 30 mg via INTRAVENOUS

## 2021-08-02 MED ORDER — ACETAMINOPHEN 500 MG PO TABS: 1000.0000 mg | ORAL_TABLET | Freq: Four times a day (QID) | ORAL | 2 refills | Status: AC | PRN

## 2021-08-02 MED ORDER — WHITE PETROLATUM EX OINT
TOPICAL_OINTMENT | CUTANEOUS | Status: AC
  Filled 2021-08-02: qty 5

## 2021-08-02 MED ORDER — PROPOFOL 500 MG/50ML IV EMUL
INTRAVENOUS | Status: DC | PRN
Start: 2021-08-02 — End: 2021-08-02
  Administered 2021-08-02: 25 ug/kg/min via INTRAVENOUS

## 2021-08-02 MED ORDER — ACETAMINOPHEN 500 MG PO TABS
1000.0000 mg | ORAL_TABLET | Freq: Four times a day (QID) | ORAL | Status: DC | PRN
  Administered 2021-08-02: 1000 mg via ORAL

## 2021-08-02 MED ORDER — ACETAMINOPHEN 500 MG PO TABS: 1000.0000 mg | ORAL_TABLET | Freq: Four times a day (QID) | ORAL | 2 refills | Status: DC | PRN

## 2021-08-02 MED ORDER — ACETAMINOPHEN 500 MG PO TABS
1000.0000 mg | ORAL_TABLET | Freq: Once | ORAL | Status: AC
  Administered 2021-08-02: 1000 mg via ORAL

## 2021-08-02 MED ORDER — SCOPOLAMINE 1 MG/3DAYS TD PT72
1.0000 | MEDICATED_PATCH | Freq: Once | TRANSDERMAL | Status: DC
  Administered 2021-08-02: 1.5 mg via TRANSDERMAL

## 2021-08-02 MED ORDER — PROMETHAZINE HCL 25 MG/ML IJ SOLN
6.2500 mg | INTRAMUSCULAR | Status: DC | PRN
Start: 2021-08-02 — End: 2021-08-02

## 2021-08-02 MED ORDER — OXYCODONE HCL 5 MG PO TABS
5.0000 mg | ORAL_TABLET | Freq: Once | ORAL | Status: AC | PRN
  Administered 2021-08-02: 5 mg via ORAL

## 2021-08-02 MED ORDER — ONDANSETRON HCL 4 MG/2ML IJ SOLN
INTRAMUSCULAR | Status: DC | PRN
  Administered 2021-08-02: 4 mg via INTRAVENOUS

## 2021-08-02 MED ORDER — ACETAMINOPHEN 500 MG PO TABS: 1000.0000 mg | ORAL_TABLET | ORAL | Status: DC

## 2021-08-02 MED ORDER — DEXAMETHASONE SODIUM PHOSPHATE 10 MG/ML IJ SOLN
INTRAMUSCULAR | Status: DC | PRN
  Administered 2021-08-02: 5 mg via INTRAVENOUS

## 2021-08-02 MED ORDER — OXYCODONE HCL 5 MG PO TABS
ORAL_TABLET | ORAL | Status: AC
  Filled 2021-08-02: qty 1

## 2021-08-02 MED ORDER — SUGAMMADEX SODIUM 200 MG/2ML IV SOLN
INTRAVENOUS | Status: DC | PRN
  Administered 2021-08-02: 200 mg via INTRAVENOUS

## 2021-08-02 SURGICAL SUPPLY — 75 items
ADH SKN CLS APL DERMABOND .7 (GAUZE/BANDAGES/DRESSINGS) ×4
APL PRP STRL LF DISP 70% ISPRP (MISCELLANEOUS)
APL SRG 38 LTWT LNG FL B (MISCELLANEOUS)
APPLICATOR ARISTA FLEXITIP XL (MISCELLANEOUS) IMPLANT
BAG SPEC RTRVL LRG 6X4 10 (ENDOMECHANICALS) ×4
BLADE CLIPPER SENSICLIP SURGIC (BLADE) IMPLANT
CABLE HIGH FREQUENCY MONO STRZ (ELECTRODE) ×3 IMPLANT
CHLORAPREP W/TINT 26 (MISCELLANEOUS) ×3 IMPLANT
COVER MAYO STAND STRL (DRAPES) ×6 IMPLANT
DECANTER SPIKE VIAL GLASS SM (MISCELLANEOUS) IMPLANT
DERMABOND ADVANCED (GAUZE/BANDAGES/DRESSINGS) ×1
DERMABOND ADVANCED .7 DNX12 (GAUZE/BANDAGES/DRESSINGS) ×8 IMPLANT
DISSECTOR BLUNT TIP ENDO 5MM (MISCELLANEOUS) IMPLANT
DRSG OPSITE POSTOP 3X4 (GAUZE/BANDAGES/DRESSINGS) IMPLANT
DURAPREP 26ML APPLICATOR (WOUND CARE) ×6 IMPLANT
ELECT REM PT RETURN 9FT ADLT (ELECTROSURGICAL) ×10
ELECTRODE REM PT RTRN 9FT ADLT (ELECTROSURGICAL) ×10 IMPLANT
GAUZE 4X4 16PLY ~~LOC~~+RFID DBL (SPONGE) ×18 IMPLANT
GLOVE SRG 8 PF TXTR STRL LF DI (GLOVE) ×5 IMPLANT
GLOVE SURG ENC MOIS LTX SZ6.5 (GLOVE) ×18 IMPLANT
GLOVE SURG ENC MOIS LTX SZ7.5 (GLOVE) ×6 IMPLANT
GLOVE SURG UNDER POLY LF SZ7 (GLOVE) ×12 IMPLANT
GLOVE SURG UNDER POLY LF SZ7.5 (GLOVE) ×9 IMPLANT
GLOVE SURG UNDER POLY LF SZ8 (GLOVE) ×5
GOWN STRL REUS W/ TWL XL LVL3 (GOWN DISPOSABLE) ×5 IMPLANT
GOWN STRL REUS W/TWL LRG LVL3 (GOWN DISPOSABLE) ×12 IMPLANT
GOWN STRL REUS W/TWL XL LVL3 (GOWN DISPOSABLE) ×5
GRASPER SUT TROCAR 14GX15 (MISCELLANEOUS) ×3 IMPLANT
HEMOSTAT ARISTA ABSORB 3G PWDR (HEMOSTASIS) IMPLANT
IRRIG SUCT STRYKERFLOW 2 WTIP (MISCELLANEOUS) ×5
IRRIGATION SUCT STRKRFLW 2 WTP (MISCELLANEOUS) ×2 IMPLANT
IV NS 1000ML (IV SOLUTION) ×15
IV NS 1000ML BAXH (IV SOLUTION) ×6 IMPLANT
KIT TURNOVER CYSTO (KITS) ×12 IMPLANT
LIGASURE VESSEL 5MM BLUNT TIP (ELECTROSURGICAL) IMPLANT
MESH HERNIA 6X6 BARD (Mesh General) ×2 IMPLANT
MESH HERNIA BARD 6X6 (Mesh General) ×1 IMPLANT
NEEDLE INSUFFLATION 120MM (ENDOMECHANICALS) ×12 IMPLANT
NS IRRIG 500ML POUR BTL (IV SOLUTION) ×6 IMPLANT
PACK BASIN DAY SURGERY FS (CUSTOM PROCEDURE TRAY) ×6 IMPLANT
PACK LAPAROSCOPY BASIN (CUSTOM PROCEDURE TRAY) ×6 IMPLANT
PAD OB MATERNITY 4.3X12.25 (PERSONAL CARE ITEMS) ×6 IMPLANT
PAD POSITIONING PINK XL (MISCELLANEOUS) ×6 IMPLANT
PENCIL SMOKE EVACUATOR (MISCELLANEOUS) ×3 IMPLANT
POUCH LAPAROSCOPIC INSTRUMENT (MISCELLANEOUS) ×6 IMPLANT
POUCH SPECIMEN RETRIEVAL 10MM (ENDOMECHANICALS) ×3 IMPLANT
RELOAD STAPLE 4.0 BLU F/HERNIA (INSTRUMENTS) IMPLANT
RELOAD STAPLE 4.8 BLK F/HERNIA (STAPLE) IMPLANT
RELOAD STAPLE HERNIA 4.0 BLUE (INSTRUMENTS) IMPLANT
RELOAD STAPLE HERNIA 4.8 BLK (STAPLE) IMPLANT
SCISSORS LAP 5X35 DISP (ENDOMECHANICALS) ×6 IMPLANT
SET IRRIG Y TYPE TUR BLADDER L (SET/KITS/TRAYS/PACK) IMPLANT
SET SUCTION IRRIG HYDROSURG (IRRIGATION / IRRIGATOR) IMPLANT
SET TUBE SMOKE EVAC HIGH FLOW (TUBING) ×15 IMPLANT
SHEARS 1100 HARMONIC 36 (ELECTROSURGICAL) IMPLANT
SPONGE T-LAP 18X18 ~~LOC~~+RFID (SPONGE) IMPLANT
STAPLER HERNIA 12 8.5 360D (INSTRUMENTS) IMPLANT
SUT MNCRL AB 4-0 PS2 18 (SUTURE) ×6 IMPLANT
SUT NOVA 0 T19/GS 22DT (SUTURE) ×3 IMPLANT
SUT VIC AB 4-0 PS2 18 (SUTURE) ×6 IMPLANT
SUT VICRYL 0 UR6 27IN ABS (SUTURE) ×3 IMPLANT
SUT VICRYL 2-0 SH 8X27 (SUTURE) ×3 IMPLANT
SYSTEM CARTER THOMASON II (TROCAR) IMPLANT
TOWEL OR 17X26 10 PK STRL BLUE (TOWEL DISPOSABLE) ×18 IMPLANT
TRAY FOL W/BAG SLVR 16FR STRL (SET/KITS/TRAYS/PACK) IMPLANT
TRAY FOLEY W/BAG SLVR 14FR LF (SET/KITS/TRAYS/PACK) ×6 IMPLANT
TRAY FOLEY W/BAG SLVR 16FR LF (SET/KITS/TRAYS/PACK)
TRAY LAPAROSCOPIC (CUSTOM PROCEDURE TRAY) ×6 IMPLANT
TROCAR ADV FIXATION 11X100MM (TROCAR) IMPLANT
TROCAR ADV FIXATION 5X100MM (TROCAR) ×6 IMPLANT
TROCAR BLADELESS OPT 12M 100M (ENDOMECHANICALS) ×6 IMPLANT
TROCAR BLADELESS OPT 5 100 (ENDOMECHANICALS) ×15 IMPLANT
TROCAR XCEL BLUNT TIP 100MML (ENDOMECHANICALS) ×3 IMPLANT
TROCAR XCEL NON-BLD 11X100MML (ENDOMECHANICALS) IMPLANT
WARMER LAPAROSCOPE (MISCELLANEOUS) ×6 IMPLANT

## 2021-08-02 NOTE — Progress Notes (Signed)
[facility] called and requested her medications be sent to the [facility] in [place] ([facility] doesn't take her insurance), scripts sent.

## 2021-08-02 NOTE — Discharge Instructions (Addendum)
 VENTRAL HERNIA REPAIR POST OPERATIVE INSTRUCTIONS  Thinking Clearly  The anesthesia may cause you to feel different for 1 or 2 days. Do not drive, drink alcohol, or make any big decisions for at least 2 days.  Nutrition When you wake up, you will be able to drink small amounts of liquid. If you do not feel sick, you can slowly advance your diet to regular foods. Continue to drink lots of fluids, usually about 8 to 10 glasses per day. Eat a high-fiber diet so you don't strain during bowel movements. High-Fiber Foods Foods high in fiber include beans, bran cereals and whole-grain breads, peas, dried fruit (figs, apricots, and dates), raspberries, blackberries, strawberries, sweet corn, broccoli, baked potatoes with skin, plums, pears, apples, greens, and nuts. Activity Slowly increase your activity. Be sure to get up and walk every hour or so to prevent blood clots. No heavy lifting or strenuous activity for 4 weeks following surgery to prevent hernias at your incision sites or recurrence of your hernia. It is normal to feel tired. You may need more sleep than usual.  Get your rest but make sure to get up and move around frequently to prevent blood clots and pneumonia.  Work and Return to School You can go back to work when you feel well enough. Discuss the timing with your surgeon. You can usually go back to school or work 1 week or less after an laparoscopic or an open repair. If your work requires heavy lifting or strenuous activity you need to be placed on light duty for 4 weeks following surgery. You can return to gym class, sports or other physical activities 4 weeks after surgery.  Wound Care You may experience significant bruising throughout the abdominal wall that may track down into the groin including into the scrotum in males.  Rest, elevating the groin and scrotum above the level of the heart, ice and compression with tight fitting underwear or an abdominal binder can help.   Always wash your hands before and after touching near your incision site. Do not soak in a bathtub until cleared at your follow up appointment. You may take a shower 24 hours after surgery. A small amount of drainage from the incision is normal. If the drainage is thick and yellow or the site is red, you may have an infection, so call your surgeon. If you have a drain in one of your incisions, it will be taken out in office when the drainage stops. Steri-Strips will fall off in 7 to 10 days or they will be removed during your first office visit. If you have dermabond glue covering over the incision, allow the glue to flake off on its own. Protect the new skin, especially from the sun. The sun can burn and cause darker scarring. Your scar will heal in about 4 to 6 weeks and will become softer and continue to fade over the next year.  The cosmetic appearance of the incisions will improve over the course of the first year after surgery. Sensation around your incision will return in a few weeks or months.  Bowel Movements After intestinal surgery, you may have loose watery stools for several days. If watery diarrhea lasts longer than 3 days, contact your surgeon. Pain medication (narcotics) can cause constipation. Increase the fiber in your diet with high-fiber foods if you are constipated. You can take an over the counter stool softener like Colace to avoid constipation.  Additional over the counter medications can also be used   if Colace isn't sufficient (for example, Milk of Magnesia or Miralax).  Pain The amount of pain is different for each person. Some people need only 1 to 3 doses of pain control medication, while others need more. Take alternating doses of tylenol and ibuprofen around the clock for the first five days following surgery.  This will provide a baseline of pain control and help with inflammation.  Take the narcotic pain medication in addition if needed for severe pain.  Contact  Your Surgeon at [phone], if you have: Pain that will not go away Pain that gets worse A fever of more than 101F (38.3C) Repeated vomiting Swelling, redness, bleeding, or bad-smelling drainage from your wound site Strong abdominal pain No bowel movement or unable to pass gas for 3 days Watery diarrhea lasting longer than 3 days  Pain Control The goal of pain control is to minimize pain, keep you moving and help you heal. Your surgical team will work with you on your pain plan. Most often a combination of therapies and medications are used to control your pain. You may also be given medication (local anesthetic) at the surgical site. This may help control your pain for several days. Extreme pain puts extra stress on your body at a time when your body needs to focus on healing. Do not wait until your pain has reached a level "10" or is unbearable before telling your doctor or nurse. It is much easier to control pain before it becomes severe. Following a laparoscopic procedure, pain is sometimes felt in the shoulder. This is due to the gas inserted into your abdomen during the procedure. Moving and walking helps to decrease the gas and the right shoulder pain.  Use the guide below for ways to manage your post-operative pain. Learn more by going to facs.org/safepaincontrol.  How Intense Is My Pain Common Therapies to Feel Better       I hardly notice my pain, and it does not interfere with my activities.  I notice my pain and it distracts me, but I can still do activities (sitting up, walking, standing).  Non-Medication Therapies  Ice (in a bag, applied over clothing at the surgical site), elevation, rest, meditation, massage, distraction (music, TV, play) walking and mild exercise Splinting the abdomen with pillows +  Non-Opioid Medications Acetaminophen (Tylenol) Non-steroidal anti-inflammatory drugs (NSAIDS) Aspirin, Ibuprofen (Motrin, Advil) Naproxen (Aleve) Take these as  needed, when you feel pain. Both acetaminophen and NSAIDs help to decrease pain and swelling (inflammation).      My pain is hard to ignore and is more noticeable even when I rest.  My pain interferes with my usual activities.  Non-Medication Therapies  +  Non-Opioid medications  Take on a regular schedule (around-the-clock) instead of as needed. (For example, Tylenol every 6 hours at 9:00 am, 3:00 pm, 9:00 pm, 3:00 am and Motrin every 6 hours at 12:00 am, 6:00 am, 12:00 pm, 6:00 pm)         I am focused on my pain, and I am not doing my daily activities.  I am groaning in pain, and I cannot sleep. I am unable to do anything.  My pain is as bad as it could be, and nothing else matters.  Non-Medication Therapies  +  Around-the-Clock Non-Opioid Medications  +  Short-acting opioids  Opioids should be used with other medications to manage severe pain. Opioids block pain and give a feeling of euphoria (feel high). Addiction, a serious side effect of opioids, is   rare with short-term (a few days) use.  Examples of short-acting opioids include: Tramadol (Ultram), Hydrocodone (Norco, Vicodin), Hydromorphone (Dilaudid), Oxycodone (Oxycontin)     The above directions have been adapted from the [employer] Surgical Patient Education Program.  Please refer to the ACS website if needed: https://www.white.net/   Louanna Raw, [facility], [place] [address], [place], [place], [place]  [address] ?  [address], [place], [place]   [address] [phone] ? [phone] ? FAX [phone] Web site: [email or web address]      Post Anesthesia Home Care Instructions  Activity: Get plenty of rest for the remainder of the day. A responsible individual must stay with you for 24 hours following the procedure.  For the next 24 hours, DO NOT: -Drive a car -Conservation officer, nature -Drink alcoholic beverages -Take any medication unless instructed by your physician -Make any legal decisions or sign important papers.  Meals: Start with liquid foods such as gelatin or soup. Progress to regular foods as tolerated. Avoid greasy, spicy, heavy foods. If nausea and/or vomiting occur, drink only clear liquids until the nausea and/or vomiting subsides. Call your physician if vomiting continues.  Special Instructions/Symptoms: Your throat may feel dry or sore from the anesthesia or the breathing tube placed in your throat during surgery. If this causes discomfort, gargle with warm salt water. The discomfort should disappear within 24 hours.  No acetaminophen/Tylenol until after 7:30pm today if needed.    No ibuprofen, Advil, Aleve, Motrin, ketorolac, meloxicam, naproxen, or other NSAIDS until after 3:30pm today if needed.

## 2021-08-02 NOTE — Op Note (Signed)
   Patient: Abigail Hughes (Jun 27, 1986, [id number])  Date of Surgery: 08/02/2021   Preoperative Diagnosis: umbilical hernia, left ovarian dermoid, possible malpositioned iud   Postoperative Diagnosis: umbilical hernia, left ovarian dermoid, possible malpositioned iud   Surgical Procedure:  General Surgery: UMBILICAL HERNIA REPAIR WITH MESH:  Gynecologic Surgery: LEFT OOPHORECTOMY: 58940 (CPT) INTRAUTERINE DEVICE (IUD) REMOVAL: 93716 (CPT)   Operative Team Members:  Surgeon(s) and Role: Panel 1:    * Laniyah Rosenwald, Nickola Major, MD - Primary Panel 2:    * Salvadore Dom, MD - Primary    * Nunzio Cobbs, MD - Assisting   Anesthesiologist: Brennan Bailey, MD CRNA: Rogers Blocker, CRNA; Suan Halter, CRNA   Anesthesia: General   Fluids:  Total I/O In: 600 [I.V.:500; IV Piggyback:100] Out: 205 [RCVEL:381; OFBPZ:0]  Complications: None  Drains:  none   Specimen:  ID Type Source Tests Collected by Time Destination  1 : LEFT OVARY Tissue PATH Other SURGICAL PATHOLOGY Ziare Cryder, Nickola Major, MD 08/02/2021 0827      Disposition:  PACU - hemodynamically stable.  Plan of Care: Discharge to home after PACU    Indications for Procedure: [name] is a 35 y.o. female who presented with an umbilical hernia.  I recommended umbilical hernia repair, possibly with mesh at time of gynecologic surgery.  The procedure itself as well as its risks, benefits and alternatives were discussed and the patient granted consent to proceed.    Findings:  Hernia Location: Umbilical Hernia Size:  2 cm wide x 1 cm tall  Mesh Size &Type:  8cm x 8cm cm bard soft mesh Mesh Position: Preperitoneal  Description of Procedure:  The patient was positioned supine, padded and secured to the bed.  The abdomen was widely prepped and draped.  A time out procedure was performed.   A curvilinear incision was made above the umbilicus and dissection was carried down through the subcutaneous  tissue to the level of the fascia.  The umbilical stalk was encircled and the hernia sac amputated off the umbilical skin.  The hernia defect was measured as 2 cm wide by 1 cm in vertical dimension.  Vicryl stay sutures were placed in the fascia and the [facility] trocar was inserted into the abdomen.  The abdomen was insufflated to 15 mmHg and the gynecology team performed their procedure.  Please see the separate operative note for their dictation.  At the conclusion of the gynecology procedure, an umbilical hernia repair with mesh was performed.  The hernia sac was scored along the perimeter of the fascial defect, entering the pre peritoneal plane.  A wide pre peritoneal dissection was performed creating a space for mesh placement.  The defect in the peritoneum was closed using 2-0 vicryl suture.  A piece of Bard Soft was opened and trimmed to 8 x 8 cm and deployed into the pre peritoneal space.  The mesh laid in taut position in the pre peritoneal plane and did not require fixation.  The space was irrigated with normal saline.  The fascial defect was closed horizontally, overtop the mesh with figure of eight 2-0 PDS suture.  The umbilical skin was tacked down to the fascial repair with 4-0 Monocryl suture.  The skin was closed with 4-0 Monocryl subcuticular suture and skin glue.    Louanna Raw, MD General, Bariatric, & Minimally Invasive Surgery [facility], [place]

## 2021-08-02 NOTE — Transfer of Care (Signed)
Immediate Anesthesia Transfer of Care Note  Patient: Abigail Hughes  Procedure(s) Performed: Procedure(s) (LRB): UMBILICAL HERNIA REPAIR WITH MESH (N/A) LEFT OOPHORECTOMY (Left) INTRAUTERINE DEVICE (IUD) REMOVAL (N/A)  Patient Location: PACU  Anesthesia Type: General  Level of Consciousness: awake, oriented, sedated and patient cooperative  Airway & Oxygen Therapy: Patient Spontanous Breathing and Patient connected to face mask oxygen  Post-op Assessment: Report given to PACU RN and Post -op Vital signs reviewed and stable  Post vital signs: Reviewed and stable  Complications: No apparent anesthesia complications Last Vitals:  Vitals Value Taken Time  BP 113/75 08/02/21 0947  Temp    Pulse 74 08/02/21 0953  Resp 13 08/02/21 0953  SpO2 100 % 08/02/21 0953  Vitals shown include unvalidated device data.  Last Pain:  Vitals:   08/02/21 0611  TempSrc: Oral  PainSc: 0-No pain      Patients Stated Pain Goal: 3 (81/59/47 0761)  Complications: No notable events documented.

## 2021-08-02 NOTE — Anesthesia Procedure Notes (Signed)
Procedure Name: Intubation Date/Time: 08/02/2021 7:39 AM Performed by: Suan Halter, CRNA Pre-anesthesia Checklist: Patient identified, Emergency Drugs available, Suction available and Patient being monitored Patient Re-evaluated:Patient Re-evaluated prior to induction Oxygen Delivery Method: Circle system utilized Preoxygenation: Pre-oxygenation with 100% oxygen Induction Type: IV induction Ventilation: Mask ventilation without difficulty Laryngoscope Size: Mac and 3 Grade View: Grade I Tube type: Oral Tube size: 7.0 mm Number of attempts: 1 Airway Equipment and Method: Stylet and Oral airway Placement Confirmation: ETT inserted through vocal cords under direct vision, positive ETCO2 and breath sounds checked- equal and bilateral Secured at: 22 cm Tube secured with: Tape Dental Injury: Teeth and Oropharynx as per pre-operative assessment

## 2021-08-02 NOTE — Interval H&P Note (Signed)
History and Physical Interval Note:  08/02/2021 7:16 AM  Abigail Hughes  has presented today for surgery, with the diagnosis of umbilical hernia, left ovarian dermoid, possible malpositioned iud.  The various methods of treatment have been discussed with the patient and family. After consideration of risks, benefits and other options for treatment, the patient has consented to  Procedure(s): LAPAROSCOPIC UMBILICAL HERNIA (N/A) LEFT LAPAROSCOPIC OVARIAN CYSTECTOMY (Left) POSSIBLE LEFT OOPHORECTOMY (Left) INTRAUTERINE DEVICE (IUD) REMOVAL (N/A) as a surgical intervention.  The patient's history has been reviewed, patient examined, no change in status, stable for surgery.  I have reviewed the patient's chart and labs.  Questions were answered to the patient's satisfaction.     Salvadore Dom

## 2021-08-02 NOTE — Anesthesia Postprocedure Evaluation (Signed)
Anesthesia Post Note  Patient: Abigail Hughes  Procedure(s) Performed: UMBILICAL HERNIA REPAIR WITH MESH (Abdomen) LEFT OOPHORECTOMY (Left: Abdomen) INTRAUTERINE DEVICE (IUD) REMOVAL (Vagina )     Patient location during evaluation: PACU Anesthesia Type: General Level of consciousness: awake and alert and oriented Pain management: pain level controlled Vital Signs Assessment: post-procedure vital signs reviewed and stable Respiratory status: spontaneous breathing, nonlabored ventilation and respiratory function stable Cardiovascular status: blood pressure returned to baseline Postop Assessment: no apparent nausea or vomiting Anesthetic complications: no   No notable events documented.  Last Vitals:  Vitals:   08/02/21 1100 08/02/21 1105  BP: 99/74   Pulse: 60   Resp: 12   Temp:  36.6 C  SpO2: 96%     Last Pain:  Vitals:   08/02/21 1135  TempSrc:   PainSc: [place]

## 2021-08-02 NOTE — H&P (Signed)
Admitting Physician: Nickola Major Nykayla Marcelli  Service: General surgery  CC: umbilical hernia  Subjective   HPI: Abigail Hughes is an 35 y.o. female who is here for elective umbilical hernia repair at time of gyn procedure.  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 Harrell Gave, office note in epic 05-26-2020, palpitations and sob gestational preg 21 wks,  no further work-up and pt told no issue since   Hx of migraines 2012   no aura   Malpositioned IUD    Umbilical hernia     Past Surgical History:  Procedure Laterality Date   ORIF RADIAL FRACTURE Right 05/04/2017   Procedure: OPEN REDUCTION INTERNAL FIXATION (ORIF) RADIAL FRACTURE;  Surgeon: Leanora Cover, MD;  Location: [place];  Service: Orthopedics;  Laterality: Right;  Right radial shaft    WISDOM TOOTH EXTRACTION     teen    Family History  Problem Relation Age of Onset   Lymphoma Father    Hyperlipidemia Father    Hypertension Maternal Grandmother     Social:  reports that she has never smoked. She has never used smokeless tobacco. She reports that she does not currently use alcohol after a past usage of about 3.0 standard drinks per week. She reports that she does not use drugs.  Allergies: No Known Allergies  Medications: Current Outpatient Medications  Medication Instructions   Cholecalciferol (VITAMIN D3) 75 MCG (3000 UT) TABS 1 tablet, Oral, Daily at bedtime   Magnesium 250 MG TABS 2 tablets, Oral, Daily at bedtime   PARAGARD INTRAUTERINE COPPER IU Intrauterine,  Once   Prenatal Vit-Fe Fumarate-FA (PRENATAL VITAMIN PO) 1 tablet, Oral, Daily at bedtime    ROS - all of the below systems  have been reviewed with the patient and positives are indicated with bold text General: chills, fever or night sweats Eyes: blurry vision or double vision ENT: epistaxis or sore throat Allergy/Immunology: itchy/watery eyes or nasal congestion Hematologic/Lymphatic: bleeding problems, blood clots or swollen lymph nodes Endocrine: temperature intolerance or unexpected weight changes Breast: new or changing breast lumps or nipple discharge Resp: cough, shortness of breath, or wheezing CV: chest pain or dyspnea on exertion GI: as per HPI GU: dysuria, trouble voiding, or hematuria MSK: joint pain or joint stiffness Neuro: TIA or stroke symptoms Derm: pruritus and skin lesion changes Psych: anxiety and depression  Objective   PE Weight 70.3 kg, last menstrual period 07/07/2021, currently breastfeeding. Constitutional: NAD; conversant; no deformities Eyes: Moist conjunctiva; no lid lag; anicteric; PERRL Neck: Trachea midline; no thyromegaly Lungs: Normal respiratory effort; no tactile fremitus CV: RRR; no palpable thrills; no pitting edema GI: Abd umbilical hernia; no palpable hepatosplenomegaly MSK: Normal range of motion of extremities; no clubbing/cyanosis Psychiatric: Appropriate affect; alert and oriented x3 Lymphatic: No palpable cervical or axillary lymphadenopathy  Results for orders placed or performed during the hospital encounter of 08/02/21 (from the past 24 hour(s))  Pregnancy, urine POC     Status: None   Collection Time: 08/02/21  5:57 AM  Result Value Ref Range   Preg Test, Ur NEGATIVE NEGATIVE    Imaging Orders  No imaging studies ordered today  Assessment and Plan   Abigail Hughes is an 35 y.o. female with an umbilical hernia here for elective repair at the time of gynecologic procedure.  The procedure itself as well as its risks, benefits and alternatives were discussed and the patient granted consent to proceed.  We will proceed as scheduled.    Felicie Morn, MD  [facility] Use [email or web address] to contact on call provider

## 2021-08-02 NOTE — Op Note (Signed)
Preoperative Diagnosis: left ovarian cyst, desires removal of intrauterine device  Postoperative Diagnosis: left ovarian dermoid, desires removal of intrauterine device   Procedure: Removal of intrauterine device, diagnostic laparoscopy, attempted left ovarian cystectomy, left oophorectomy  Surgeon: Dr Sumner Boast  Assistant: Dr Josefa Half, an MD assistant was necessary for tissue manipulation, retraction and positioning due to the complexity of the case and hospital policies. Dr Quincy Simmonds helped with attempted cystectomy by creating counter traction and helped with retraction and tissue manipulation for the oophorectomy.   Please see Dr Stechschulte's operative note for repair of an umbilical hernia.  Anesthesia: General  EBL: 5 cc  Fluids: 500 cc LR  Urine output: 300 cc  Indications for surgery: The patient is a 35 year old female, with a persistent, complex left ovarian cyst consistent with a dermoid. It measured just under 7 cm on ultrasound in May. She desires removal of her intrauterine device.  The patient is aware of the risks and complications involved with the surgery and consent was obtained prior to the procedure. The patient also has an umbilical hernia that will be repaired by Dr Thermon Leyland.   Findings: Normal sized uterus with a small pedunculated fundal fibroid, large left ovarian dermoid cyst, normal right ovary, normal tubes bilaterally. Normal liver edge.   Procedure: The patient was taken to the operating room with an IV in placed. She was placed in the dorsal lithotomy position. General anesthesia was administered. She was prepped and draped in the usual sterile fashion for an abdominal, vaginal surgery. A speculum was in the vagina and the intrauterine device was removed with a ringed forceps. A hulka uterine manipulator was placed. A foley catheter was placed.   Dr Thermon Leyland placed a hassan trocar in the umbilicus, then left to return after the gynecological  portion of the case was completed. The abdominal cavity was insufflated with CO2.  The patient was placed in trendelenburg and the abdominal pelvic cavity was inspected. 3 more trocars were placed, all 5 mm in size. 1 in either lower quadrant approximately 3 cm medial to the anterior superior iliac spine, and one approximately 8 cm superior to the pubic symphysis in the midline. These areas were injected with 0.25% marcaine, incised with a #11 blades and the trocars were inserted with direct visualization with the laparoscope. The abdominal pelvic cavity was again inspected. The left ureter was identified and noted to be far from the surgical site.  The left ovary was elevated and the harmonic scalpel was used to incise the ovarian cortex. On attempted removal of the dermoid, the cyst opened and drained sebaceous material. The cyst could only be partially separated from the ovary it was densely adherent at it's base. At this point the decision was made to remove the ovary secondary to concern of not being able to remove all of the dermoid cyst and the risk of recurrence.   The infundibulopelvic ligament was transected with the harmonic scalpel above the tube. The mesovarium and uteroovarian ligament were transected with the harmonic scalpel. The 10 mm endopouch was placed through the 11 hassan trocar and the cyst was placed inside the bag. The end of the bag was brought up into the trocar. The abdominal pelvic cavity was then irrigated and suctioned dry several times. Approximately 3 liters of fluid was used for irrigation. The fluid was completely clear at the end of the procedure.   Pressure was released and hemostasis was noted to be excellent. The hassan trocar was removed and  the ovary was removed from within the endopouch.   The abdomen was allowed to desufflate and the trocars were removed. The 3 lower incisions were closed with subcuticular stitches of 4-0 Monocryl and dermabond was placed over the  incisions.  Dr Thermon Leyland then returned to finish his portion of the surgery.  The hulka tenaculum was removed.

## 2021-08-03 ENCOUNTER — Encounter (HOSPITAL_BASED_OUTPATIENT_CLINIC_OR_DEPARTMENT_OTHER): Payer: Self-pay | Admitting: Surgery

## 2021-08-03 LAB — SURGICAL PATHOLOGY

## 2021-08-16 ENCOUNTER — Encounter: Payer: Self-pay | Admitting: Obstetrics and Gynecology

## 2021-08-16 ENCOUNTER — Other Ambulatory Visit: Payer: Self-pay

## 2021-08-16 ENCOUNTER — Ambulatory Visit (INDEPENDENT_AMBULATORY_CARE_PROVIDER_SITE_OTHER): Payer: 59 | Admitting: Obstetrics and Gynecology

## 2021-08-16 VITALS — BP 100/60 | HR 62 | Ht 64.0 in | Wt 146.0 lb

## 2021-08-16 DIAGNOSIS — Z90721 Acquired absence of ovaries, unilateral: Secondary | ICD-10-CM

## 2021-08-16 NOTE — Progress Notes (Signed)
GYNECOLOGY  VISIT   HPI: 35 y.o.   Married White or Caucasian Not Hispanic or Latino  female   [phone] with No LMP recorded. (Menstrual status: IUD).   here for post op check s/p left oophorectomy (unresectable dermoid).  Pathology was benign. She had an umbilical hernia repair done by Dr Thermon Leyland just after the oophorectomy. Most of the time she has a 0/10 pain level. At max her pain is 1-2/10. She is just getting over a GI bug, both of her kids have been sick. She is improving, diarrhea is getting better.   GYNECOLOGIC HISTORY: No LMP recorded. (Menstrual status: IUD). Contraception: condoms Menopausal hormone therapy: none        OB History     Gravida  3   Para  2   Term  2   Preterm  0   AB  0   Living  2      SAB  0   IAB  0   Ectopic  0   Multiple  0   Live Births  2        Obstetric Comments  FTNSVD            Patient Active Problem List   Diagnosis Date Noted   Gastroesophageal reflux disease 02/05/2018   Hiatal hernia 02/05/2018   Pharyngoesophageal dysphagia 01/21/2018   Uterine fibroids affecting pregnancy in second trimester 11/16/2015   Encounter for supervision of normal first pregnancy in first trimester 10/12/2015   Hx of migraines 09/21/2015   Migraines 01/09/2011   High blood cholesterol level     Past Medical History:  Diagnosis Date   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 Harrell Gave, office note in epic 05-26-2020, palpitations and sob gestational preg 21 wks,  no further work-up and pt told no issue since   Hx of migraines 2012   no aura   Malpositioned IUD    Umbilical hernia     Past Surgical History:  Procedure Laterality Date   IUD REMOVAL N/A  08/02/2021   Procedure: INTRAUTERINE DEVICE (IUD) REMOVAL;  Surgeon: Salvadore Dom, MD;  Location: [place];  Service: Gynecology;  Laterality: N/A;   OOPHORECTOMY Left 08/02/2021   Procedure: LEFT OOPHORECTOMY;  Surgeon: Salvadore Dom, MD;  Location: [facility];  Service: Gynecology;  Laterality: Left;   ORIF RADIAL FRACTURE Right 05/04/2017   Procedure: OPEN REDUCTION INTERNAL FIXATION (ORIF) RADIAL FRACTURE;  Surgeon: Leanora Cover, MD;  Location: [place];  Service: Orthopedics;  Laterality: Right;  Right radial shaft    UMBILICAL HERNIA REPAIR N/A 08/02/2021   Procedure: UMBILICAL HERNIA REPAIR WITH MESH;  Surgeon: Thermon Leyland, Nickola Major, MD;  Location: [place];  Service: General;  Laterality: N/A;   WISDOM TOOTH EXTRACTION     teen    Current Outpatient Medications  Medication Sig Dispense Refill   acetaminophen (TYLENOL) 500 MG tablet Take 2 tablets (1,000 mg total) by mouth every 6 (six) hours as needed. 100 tablet 2   Cholecalciferol (VITAMIN D3) 75 MCG (3000 UT) TABS Take 1 tablet by mouth at bedtime.     ibuprofen (ADVIL) 800 MG tablet Take  1 tablet (800 mg total) by mouth every 8 (eight) hours as needed. 30 tablet 0   Magnesium 250 MG TABS Take 2 tablets by mouth at bedtime.     oxyCODONE (OXY IR/ROXICODONE) 5 MG immediate release tablet Take 1 tablet (5 mg total) by mouth every 4 (four) hours as needed for severe pain. 20 tablet 0   Prenatal Vit-Fe Fumarate-FA (PRENATAL VITAMIN PO) Take 1 tablet by mouth at bedtime.     No current facility-administered medications for this visit.     ALLERGIES: Patient has no known allergies.  Family History  Problem Relation Age of Onset   Lymphoma Father    Hyperlipidemia Father    Hypertension Maternal Grandmother     Social History   Socioeconomic History   Marital status: Married    Spouse name: Fara Worthy   Number of children: Not on file   Years  of education: Masters    Highest education level: Not on file  Occupational History   Occupation: Pharmacist, hospital     Comment: [employer]  Tobacco Use   Smoking status: Never   Smokeless tobacco: Never  Scientific laboratory technician Use: Never used  Substance and Sexual Activity   Alcohol use: Not Currently    Alcohol/week: 3.0 standard drinks    Types: 3 Standard drinks or equivalent per week   Drug use: Never   Sexual activity: Yes    Partners: Male    Birth control/protection: I.U.D.  Other Topics Concern   Not on file  Social History Narrative   Not on file   Social Determinants of Health   Financial Resource Strain: Not on file  Food Insecurity: Not on file  Transportation Needs: Not on file  Physical Activity: Not on file  Stress: Not on file  Social Connections: Not on file  Intimate Partner Violence: Not on file    ROS  PHYSICAL EXAMINATION:    There were no vitals taken for this visit.    General appearance: alert, cooperative and appears stated age Abdomen: soft, mildly tender near her incisions, non distended, no masses,  no organomegaly Incisions: healing well  1. S/P left oophorectomy and hernia repair Healing well Routine f/u

## 2022-12-05 NOTE — Progress Notes (Signed)
37 y.o. G70P2002 Married White or Caucasian Not Hispanic or Latino female here for annual exam.   Hse states that around her cycle she feels mentally frazzled. She states that she stopped breast feeding about 5 months ago and she still has milk.  Period Cycle (Days): 28 Period Duration (Days): 3 Period Pattern: Regular Menstrual Flow: Light Menstrual Control:  (cup) Dysmenorrhea: (!) Mild Dysmenorrhea Symptoms: Cramping, Headache, Diarrhea, Nausea Over the last 3-4 months she has been having mood changes for 1-2 weeks prior to her cycle. Not coping as well. Not interested in medication.  Condoms for contraception, happy with it.   Patient's last menstrual period was 11/19/2022.          Sexually active: Yes.    The current method of family planning is condoms sometimes.    Exercising: Yes.     Walking  Smoker:  no  Health Maintenance: Pap:  20/20/20 WNL 11/15/17 WNL hr hpv neg,  History of abnormal Pap:  no MMG:  n/a BMD:   n/a Colonoscopy: none  TDaP:  01/26/16  Gardasil: n/a, declines.    reports that she has never smoked. She has never used smokeless tobacco. She reports that she does not currently use alcohol after a past usage of about 3.0 standard drinks of alcohol per week. She reports that she does not use drugs. Homemaker, sons are 6 and 2. 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 Harrell Gave, office note in epic 05-26-2020, palpitations and sob gestational preg 21 wks,  no further work-up and pt told no issue since   Hx of migraines 2012   no aura   Malpositioned IUD    Umbilical hernia      Past Surgical History:  Procedure Laterality Date   IUD REMOVAL N/A 08/02/2021   Procedure: INTRAUTERINE DEVICE (IUD) REMOVAL;  Surgeon: Salvadore Dom, MD;  Location: [place];  Service: Gynecology;  Laterality: N/A;   OOPHORECTOMY Left 08/02/2021   Procedure: LEFT OOPHORECTOMY;  Surgeon: Salvadore Dom, MD;  Location: [facility];  Service: Gynecology;  Laterality: Left;   ORIF RADIAL FRACTURE Right 05/04/2017   Procedure: OPEN REDUCTION INTERNAL FIXATION (ORIF) RADIAL FRACTURE;  Surgeon: Leanora Cover, MD;  Location: [place];  Service: Orthopedics;  Laterality: Right;  Right radial shaft    UMBILICAL HERNIA REPAIR N/A 08/02/2021   Procedure: UMBILICAL HERNIA REPAIR WITH MESH;  Surgeon: Thermon Leyland, Nickola Major, MD;  Location: [place];  Service: General;  Laterality: N/A;   WISDOM TOOTH EXTRACTION     teen    Current Outpatient Medications  Medication Sig Dispense Refill   ibuprofen (ADVIL) 800 MG tablet Take 1 tablet (800 mg total) by mouth every 8 (eight) hours as needed. 30 tablet 0   No current facility-administered medications for this visit.    Family History  Problem Relation Age of Onset   Lymphoma Father    Hyperlipidemia Father    Hypertension Maternal Grandmother     Review of Systems  All other  systems reviewed and are negative.   Exam:   BP 122/74   Pulse 68   Ht '5\' 4"'$  (1.626 m)   Wt 156 lb (70.8 kg)   LMP 11/19/2022   SpO2 100%   BMI 26.78 kg/m   Weight change: '@WEIGHTCHANGE'$ @ Height:   Height: '5\' 4"'$  (162.6 cm)  Ht Readings from Last 3 Encounters:  12/14/22 '5\' 4"'$  (1.626 m)  08/16/21 '5\' 4"'$  (1.626 m)  08/02/21 '5\' 4"'$  (1.626 m)    General appearance: alert, cooperative and appears stated age Head: Normocephalic, without obvious abnormality, atraumatic Neck: no adenopathy, supple, symmetrical, trachea midline and thyroid normal to inspection and palpation Lungs: clear to  auscultation bilaterally Cardiovascular: regular rate and rhythm Breasts: normal appearance, no masses or tenderness Abdomen: soft, non-tender; non distended,  no masses,  no organomegaly Extremities: extremities normal, atraumatic, no cyanosis or edema Skin: Skin color, texture, turgor normal. No rashes or lesions Lymph nodes: Cervical, supraclavicular, and axillary nodes normal. No abnormal inguinal nodes palpated Neurologic: Grossly normal   Pelvic: External genitalia:  no lesions              Urethra:  normal appearing urethra with no masses, tenderness or lesions              Bartholins and Skenes: normal                 Vagina: normal appearing vagina with normal color and discharge, no lesions              Cervix: no lesions and friable with pap               Bimanual Exam:  Uterus:  normal size, contour, position, consistency, mobility, non-tender              Adnexa: no mass, fullness, tenderness               Rectovaginal: Confirms               Anus:  normal sphincter tone, no lesions   1. Well woman exam Discussed breast self exam Discussed calcium and vit D intake  2. Screening for cervical cancer - Cytology - PAP  3. Vitamin D deficiency Not currently on vid d - VITAMIN D 25 Hydroxy (Vit-D Deficiency, Fractures); Future  4. Elevated cholesterol Return for fasting labs - Lipid panel; Future  5. Other fatigue - CBC; Future - TSH; Future  6. Laboratory exam ordered as part of routine general medical examination - Comprehensive metabolic panel; Future

## 2022-12-14 ENCOUNTER — Other Ambulatory Visit (HOSPITAL_COMMUNITY)
Admission: RE | Admit: 2022-12-14 | Discharge: 2022-12-14 | Disposition: A | Discharge status: Home / Self Care | Payer: 59 | Source: Ambulatory Visit | Attending: Obstetrics and Gynecology | Admitting: Obstetrics and Gynecology

## 2022-12-14 ENCOUNTER — Encounter: Payer: Self-pay | Admitting: Obstetrics and Gynecology

## 2022-12-14 ENCOUNTER — Ambulatory Visit (INDEPENDENT_AMBULATORY_CARE_PROVIDER_SITE_OTHER): Payer: 59 | Admitting: Obstetrics and Gynecology

## 2022-12-14 VITALS — BP 122/74 | HR 68 | Ht 64.0 in | Wt 156.0 lb

## 2022-12-14 DIAGNOSIS — E559 Vitamin D deficiency, unspecified: Secondary | ICD-10-CM | POA: Diagnosis not present

## 2022-12-14 DIAGNOSIS — R5383 Other fatigue: Secondary | ICD-10-CM | POA: Diagnosis not present

## 2022-12-14 DIAGNOSIS — Z01419 Encounter for gynecological examination (general) (routine) without abnormal findings: Secondary | ICD-10-CM | POA: Diagnosis not present

## 2022-12-14 DIAGNOSIS — E7800 Pure hypercholesterolemia, unspecified: Secondary | ICD-10-CM | POA: Diagnosis not present

## 2022-12-14 DIAGNOSIS — Z0000 Encounter for general adult medical examination without abnormal findings: Secondary | ICD-10-CM

## 2022-12-14 DIAGNOSIS — Z124 Encounter for screening for malignant neoplasm of cervix: Secondary | ICD-10-CM

## 2022-12-18 LAB — CYTOLOGY - PAP
Comment: NEGATIVE
Diagnosis: NEGATIVE
High risk HPV: NEGATIVE

## 2022-12-19 ENCOUNTER — Other Ambulatory Visit: Payer: 59

## 2022-12-19 DIAGNOSIS — E559 Vitamin D deficiency, unspecified: Secondary | ICD-10-CM

## 2022-12-19 DIAGNOSIS — E7800 Pure hypercholesterolemia, unspecified: Secondary | ICD-10-CM

## 2022-12-19 DIAGNOSIS — Z0000 Encounter for general adult medical examination without abnormal findings: Secondary | ICD-10-CM

## 2022-12-19 DIAGNOSIS — R5383 Other fatigue: Secondary | ICD-10-CM

## 2022-12-20 LAB — COMPREHENSIVE METABOLIC PANEL
AG Ratio: 2.1 (calc) (ref 1.0–2.5)
ALT: 12 U/L (ref 6–29)
AST: 15 U/L (ref 10–30)
Albumin: 4.6 g/dL (ref 3.6–5.1)
Alkaline phosphatase (APISO): 80 U/L (ref 31–125)
BUN: 13 mg/dL (ref 7–25)
CO2: 26 mmol/L (ref 20–32)
Calcium: 9.7 mg/dL (ref 8.6–10.2)
Chloride: 107 mmol/L (ref 98–110)
Creat: 0.71 mg/dL (ref 0.50–0.97)
Globulin: 2.2 g/dL (calc) (ref 1.9–3.7)
Glucose, Bld: 90 mg/dL (ref 65–99)
Potassium: 5.3 mmol/L (ref 3.5–5.3)
Sodium: 142 mmol/L (ref 135–146)
Total Bilirubin: 0.4 mg/dL (ref 0.2–1.2)
Total Protein: 6.8 g/dL (ref 6.1–8.1)

## 2022-12-20 LAB — CBC
HCT: 41.8 % (ref 35.0–45.0)
Hemoglobin: 13.6 g/dL (ref 11.7–15.5)
MCH: 30 pg (ref 27.0–33.0)
MCHC: 32.5 g/dL (ref 32.0–36.0)
MCV: 92.1 fL (ref 80.0–100.0)
MPV: 10.7 fL (ref 7.5–12.5)
Platelets: 306 10*3/uL (ref 140–400)
RBC: 4.54 10*6/uL (ref 3.80–5.10)
RDW: 12.7 % (ref 11.0–15.0)
WBC: 5.4 10*3/uL (ref 3.8–10.8)

## 2022-12-20 LAB — LIPID PANEL
Cholesterol: 236 mg/dL — ABNORMAL HIGH (ref <200)
HDL: 71 mg/dL (ref >=50)
LDL Cholesterol (Calc): 147 mg/dL (calc) — ABNORMAL HIGH
Non-HDL Cholesterol (Calc): 165 mg/dL (calc) — ABNORMAL HIGH (ref <130)
Total CHOL/HDL Ratio: 3.3 (calc) (ref <5.0)
Triglycerides: 78 mg/dL (ref <150)

## 2022-12-20 LAB — TSH: TSH: 1.19 mIU/L

## 2022-12-20 LAB — VITAMIN D 25 HYDROXY (VIT D DEFICIENCY, FRACTURES): Vit D, 25-Hydroxy: 28 ng/mL — ABNORMAL LOW (ref 30–100)

## 2023-02-21 DIAGNOSIS — L821 Other seborrheic keratosis: Secondary | ICD-10-CM | POA: Diagnosis not present

## 2023-02-21 DIAGNOSIS — D229 Melanocytic nevi, unspecified: Secondary | ICD-10-CM | POA: Diagnosis not present

## 2023-02-21 DIAGNOSIS — L578 Other skin changes due to chronic exposure to nonionizing radiation: Secondary | ICD-10-CM | POA: Diagnosis not present

## 2023-03-06 DIAGNOSIS — M79672 Pain in left foot: Secondary | ICD-10-CM | POA: Diagnosis not present

## 2023-03-06 DIAGNOSIS — M25571 Pain in right ankle and joints of right foot: Secondary | ICD-10-CM | POA: Insufficient documentation

## 2023-03-06 DIAGNOSIS — M79671 Pain in right foot: Secondary | ICD-10-CM | POA: Diagnosis not present

## 2023-07-12 ENCOUNTER — Ambulatory Visit: Payer: Self-pay | Admitting: Gastroenterology

## 2023-07-24 ENCOUNTER — Ambulatory Visit: Payer: BC Managed Care – PPO | Admitting: Gastroenterology

## 2023-07-24 ENCOUNTER — Encounter: Payer: Self-pay | Admitting: Gastroenterology

## 2023-07-24 VITALS — BP 130/81 | HR 91 | Temp 97.8°F | Ht 64.0 in | Wt 162.0 lb

## 2023-07-24 DIAGNOSIS — R140 Abdominal distension (gaseous): Secondary | ICD-10-CM

## 2023-07-24 DIAGNOSIS — R110 Nausea: Secondary | ICD-10-CM

## 2023-07-24 MED ORDER — OMEPRAZOLE 40 MG PO CPDR: 40.0000 mg | DELAYED_RELEASE_CAPSULE | Freq: Every day | ORAL | 1 refills | Status: AC

## 2023-07-24 NOTE — Progress Notes (Signed)
Wyline Mood MD, MRCP(U.K) [address]  [address]  [place], [place] [address]  Main: [phone]  Fax: [phone]   Gastroenterology Consultation  Referring Provider:     No ref. provider found Primary Care Physician:  Patient, No Pcp Per Primary Gastroenterologist:  Dr. Wyline Mood  Reason for Consultation:     Abdominal discomfort        HPI:   Abigail Hughes is a 37 y.o. y/o female here for nausea vomiting Next 9 in March 2024 CBC, CMP, TSH was normal.   She states that she got sick in August 2024 following which she had nonspecific abdominal discomfort going on which still persists a sense of nausea but no pain.  All over her abdomen.  Usually begins after meals.  Associated with bloating.  She also has instances where her stool is very loose consistency of porridge.  Passes a lot of gas and foul-smelling.  Has taken Advil on and off for aches and pains but not very often.  No family history of any cancers not exposed to any tobacco or marijuana.  No family history of gallbladder problems.  Denies any use of artificial sugars or sweeteners.  Denies any use of chewing gum.  No weight loss but has gained weight.  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 Cristal Deer, office note in epic 05-26-2020, palpitations and sob gestational preg 21 wks,  no further work-up and pt told no issue since   Hx of migraines 2012   no aura   Malpositioned IUD    Umbilical hernia     Past Surgical History:  Procedure Laterality Date   IUD REMOVAL N/A 08/02/2021   Procedure: INTRAUTERINE DEVICE (IUD) REMOVAL;  Surgeon: Romualdo Bolk, MD;  Location: [facility] [place];   Service: Gynecology;  Laterality: N/A;   OOPHORECTOMY Left 08/02/2021   Procedure: LEFT OOPHORECTOMY;  Surgeon: Romualdo Bolk, MD;  Location: [facility];  Service: Gynecology;  Laterality: Left;   ORIF RADIAL FRACTURE Right 05/04/2017   Procedure: OPEN REDUCTION INTERNAL FIXATION (ORIF) RADIAL FRACTURE;  Surgeon: Betha Loa, MD;  Location: [facility];  Service: Orthopedics;  Laterality: Right;  Right radial shaft    UMBILICAL HERNIA REPAIR N/A 08/02/2021   Procedure: UMBILICAL HERNIA REPAIR WITH MESH;  Surgeon: Stechschulte, Hyman Hopes, MD;  Location: [facility];  Service: General;  Laterality: N/A;   WISDOM TOOTH EXTRACTION     teen    Prior to Admission medications   Medication Sig Start Date End Date Taking? Authorizing Provider  cholecalciferol (VITAMIN D3) 25 MCG (1000 UNIT) tablet Take 1,000 Units by mouth daily.   Yes [provider]  magnesium 30 MG tablet Take 30 mg by mouth daily.   Yes [provider]    Family History  Problem Relation Age of Onset   Lymphoma Father    Hyperlipidemia Father    Hypertension Maternal Grandmother      Social History   Tobacco Use   Smoking status: Never   Smokeless tobacco: Never  Vaping Use   Vaping status: Never Used  Substance Use  Topics   Alcohol use: Not Currently    Alcohol/week: 3.0 standard drinks of alcohol    Types: 3 Standard drinks or equivalent per week   Drug use: Never    Allergies as of 07/24/2023   (No Known Allergies)    Review of Systems:    All systems reviewed and negative except where noted in HPI.   Physical Exam:  BP 130/81   Pulse 91   Temp 97.8 F (36.6 C) (Oral)   Ht 5\' 4"  (1.626 m)   Wt 162 lb (73.5 kg)   BMI 27.81 kg/m  No LMP recorded. Psych:  Alert and cooperative. Normal mood and affect. General:   Alert,  Well-developed, well-nourished, pleasant and cooperative in NAD Head:  Normocephalic and atraumatic. Eyes:   Sclera clear, no icterus.   Conjunctiva pink. Ears:  Normal auditory acuity. Neck:  Supple; no masses or thyromegaly. Lungs:  Respirations even and unlabored.  Clear throughout to auscultation.   No wheezes, crackles, or rhonchi. No acute distress. Heart:  Regular rate and rhythm; no murmurs, clicks, rubs, or gallops. Abdomen:  Normal bowel sounds.  No bruits.  Soft, non-tender and non-distended without masses, hepatosplenomegaly or hernias noted.  No guarding or rebound tenderness.    Neurologic:  Alert and oriented x3;  grossly normal neurologically. Psych:  Alert and cooperative. Normal mood and affect.  Imaging Studies: No results found.  Assessment and Plan:   Abigail Hughes is a 37 y.o. 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  In addition has gained weight.  Plan 1.  H. pylori breath test, celiac serology, check TSH 2.  Low FODMAP diet [facility] recommendations patient information provided 3.  Stop NSAID use use Tylenol for pain 4.  Trial of Prilosec 40 mg for 8 weeks 5.  If no better at next visit we will consider HIDA scan to evaluate for biliary dyskinesia/biliary colic versus trial of Xifaxan for IBS diarrhea 6.  No red flag signs at this point of time to be concerned to perform any endoscopy evaluation yet  Follow up in 8 weeks  Dr Wyline Mood MD,MRCP(U.K)

## 2023-07-26 LAB — CELIAC DISEASE AB SCREEN W/RFX
Antigliadin Abs, IgA: 2 U (ref 0–19)
IgA/Immunoglobulin A, Serum: 97 mg/dL (ref 87–352)

## 2023-07-26 LAB — TSH: TSH: 1.19 u[IU]/mL (ref 0.450–4.500)

## 2023-07-26 LAB — H. PYLORI BREATH TEST: H pylori Breath Test: NEGATIVE

## 2023-07-27 ENCOUNTER — Encounter: Payer: Self-pay | Admitting: Gastroenterology

## 2023-08-27 ENCOUNTER — Other Ambulatory Visit: Payer: Self-pay

## 2023-08-29 NOTE — Progress Notes (Deleted)
Celso Amy, PA-C [address]  [address]  [place], [place] [address]  Main: [phone]  Fax: [phone]   Primary Care Physician: Patient, No Pcp Per  Primary Gastroenterologist:  ***  CC: F/U Bloating, abdominal discomfort, nausea, loose stools.   HPI: Jalena Woelk is a 37 y.o. female, estab. Pt. Of Dr. Tobi Bastos, returns for 1 month f/u Bloating, abdominal discomfort, nausea, and loose stools.  She saw Dr. Tobi Bastos 1 month ago.  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  GI symptoms started after acute illness 05/2023.  She has not had any alarm symptoms.  Was started on Prilosec 40 Mg daily 8 weeks ago.  Also advised to try low FODMAP diet and stop NSAIDs.  Labs 07/24/2023: Negative H. pylori breath test, negative celiac panel, normal TSH.  12/2022 labs: Normal CBC, CMP, TSH.   Current Outpatient Medications  Medication Sig Dispense Refill   cholecalciferol (VITAMIN D3) 25 MCG (1000 UNIT) tablet Take 1,000 Units by mouth daily.     magnesium 30 MG tablet Take 30 mg by mouth daily.     Magnesium Gluconate (MAGNESIUM 27 PO)      omeprazole (PRILOSEC) 40 MG capsule Take 1 capsule (40 mg total) by mouth daily. 30 capsule 1   No current facility-administered medications for this visit.    Allergies as of 08/30/2023   (No Known Allergies)    Past Medical History:  Diagnosis Date   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 Cristal Deer, office note in epic 05-26-2020, palpitations and sob gestational preg 21 wks,  no further work-up and pt told no issue since   Hx of migraines 2012   no aura   Malpositioned IUD    Umbilical hernia     Past Surgical History:  Procedure Laterality Date   IUD REMOVAL  N/A 08/02/2021   Procedure: INTRAUTERINE DEVICE (IUD) REMOVAL;  Surgeon: Romualdo Bolk, MD;  Location: [facility] [place];  Service: Gynecology;  Laterality: N/A;   OOPHORECTOMY Left 08/02/2021   Procedure: LEFT OOPHORECTOMY;  Surgeon: Romualdo Bolk, MD;  Location: [facility];  Service: Gynecology;  Laterality: Left;   ORIF RADIAL FRACTURE Right 05/04/2017   Procedure: OPEN REDUCTION INTERNAL FIXATION (ORIF) RADIAL FRACTURE;  Surgeon: Betha Loa, MD;  Location: [facility];  Service: Orthopedics;  Laterality: Right;  Right radial shaft    UMBILICAL HERNIA REPAIR N/A 08/02/2021   Procedure: UMBILICAL HERNIA REPAIR WITH MESH;  Surgeon: Stechschulte, Hyman Hopes, MD;  Location: [facility];  Service: General;  Laterality: N/A;   WISDOM TOOTH EXTRACTION     teen    Review of Systems:    All systems reviewed and negative except where noted in HPI.   Physical Examination:   There were no vitals taken for this visit.  General: Well-nourished, well-developed in no acute distress.  Lungs: Clear to auscultation bilaterally. Non-labored. Heart: Regular rate and rhythm, no murmurs rubs or gallops.  Abdomen: Bowel sounds are normal; Abdomen is Soft; No hepatosplenomegaly, masses or hernias;  No Abdominal Tenderness; No guarding or rebound tenderness. Neuro: Alert and oriented x 3.  Grossly intact.  Psych: Alert and cooperative,  normal mood and affect.   Imaging Studies: No results found.  Assessment and Plan:   Alexana Phanor is a 37 y.o. y/o female for follow-up of multiple GI symptoms.  Recent H. pylori breath test was negative.  Celiac labs negative.  Normal TSH.  She has no alarm symptoms.  1.  GERD 2.  Bloating 3.  Postinfectious IBS  Plan: -Continue Protonix 40 Mg daily -Low FODMAP diet -Align probiotic 1 capsule daily -Xifaxan?    Celso Amy, PA-C  Follow up ***  BP check ***

## 2023-08-30 ENCOUNTER — Ambulatory Visit: Payer: BC Managed Care – PPO | Admitting: Physician Assistant

## 2023-10-23 ENCOUNTER — Ambulatory Visit: Payer: BC Managed Care – PPO | Admitting: Physician Assistant

## 2023-11-26 ENCOUNTER — Encounter: Payer: Self-pay | Admitting: Physician Assistant

## 2023-11-26 ENCOUNTER — Ambulatory Visit (INDEPENDENT_AMBULATORY_CARE_PROVIDER_SITE_OTHER): Payer: BC Managed Care – PPO | Admitting: Physician Assistant

## 2023-11-26 VITALS — BP 122/80 | HR 76 | Temp 97.8°F | Ht 64.0 in | Wt 161.8 lb

## 2023-11-26 DIAGNOSIS — K219 Gastro-esophageal reflux disease without esophagitis: Secondary | ICD-10-CM | POA: Diagnosis not present

## 2023-11-26 DIAGNOSIS — R140 Abdominal distension (gaseous): Secondary | ICD-10-CM | POA: Diagnosis not present

## 2023-11-26 NOTE — Progress Notes (Signed)
 Celso Amy, PA-C [address]  [address]  [place], [place] [address]  Main: [phone]  Fax: [phone]   Primary Care Physician: Patient, No Pcp Per  Primary Gastroenterologist:  Celso Amy, PA-C / Dr. Wyline Mood    CC: Follow-up abdominal bloating and GERD  HPI: Abigail Hughes is a 38 y.o. female, established patient of Dr. Tobi Bastos, returns for 38-month follow-up abdominal discomfort, bloating, and diarrhea.  Malodorous gas.  No marijuana or tobacco use.  No family history of GI malignancies.  Denies weight loss or rectal bleeding.  07/2023 labs: Negative H. pylori breath test, negative celiac panel, normal TSH.  She tried low FODMAP diet and Prilosec 20 Mg once daily with great benefit.  She states her gas and bloating have greatly improved.  She is avoiding dairy products and sugar alcohols such as mannitol.  Low FODMAP diet helped.  She did have a flareup of acid reflux which is currently controlled on OTC Prilosec 20 Mg once daily.  She denies breakthrough heartburn or dysphagia on this dose.  No new GI concerns today.  01/2018: Barium swallow tablet (to evaluate dysphagia): Showed small sliding hiatal hernia and inducible acid reflux.  No strictures.  Normal esophageal motility.  Current Outpatient Medications  Medication Sig Dispense Refill   cholecalciferol (VITAMIN D3) 25 MCG (1000 UNIT) tablet Take 1,000 Units by mouth daily.     magnesium 30 MG tablet Take 30 mg by mouth daily.     Magnesium Gluconate (MAGNESIUM 27 PO)      omeprazole (PRILOSEC) 40 MG capsule Take 1 capsule (40 mg total) by mouth daily. 30 capsule 1   No current facility-administered medications for this visit.    Allergies as of 11/26/2023   (No Known Allergies)    Past Medical History:  Diagnosis Date   Dermoid cyst of left ovary 04/2020   GERD (gastroesophageal reflux disease)    Headache, unspecified    per pt intermittantly only take tylenol/ advil as needed   Hiatal hernia     History of COVID-19 10/2020   per pt mild symptoms that resolved   History of palpitations    evaluated by cardiology-- dr b. Cristal Deer, office note in epic 05-26-2020, palpitations and sob gestational preg 21 wks,  no further work-up and pt told no issue since   Hx of migraines 2012   no aura   Malpositioned IUD    Umbilical hernia     Past Surgical History:  Procedure Laterality Date   IUD REMOVAL N/A 08/02/2021   Procedure: INTRAUTERINE DEVICE (IUD) REMOVAL;  Surgeon: Romualdo Bolk, MD;  Location: [facility] [place];  Service: Gynecology;  Laterality: N/A;   OOPHORECTOMY Left 08/02/2021   Procedure: LEFT OOPHORECTOMY;  Surgeon: Romualdo Bolk, MD;  Location: [facility];  Service: Gynecology;  Laterality: Left;   ORIF RADIAL FRACTURE Right 05/04/2017   Procedure: OPEN REDUCTION INTERNAL FIXATION (ORIF) RADIAL FRACTURE;  Surgeon: Betha Loa, MD;  Location: [facility];  Service: Orthopedics;  Laterality: Right;  Right radial shaft    UMBILICAL HERNIA REPAIR N/A 08/02/2021   Procedure: UMBILICAL HERNIA REPAIR WITH MESH;  Surgeon: Stechschulte, Hyman Hopes, MD;  Location: [facility];  Service: General;  Laterality: N/A;   WISDOM TOOTH EXTRACTION     teen    Review of Systems:    All systems reviewed and negative except where noted in HPI.   Physical Examination:   BP 122/80   Pulse  76   Temp 97.8 F (36.6 C)   Ht 5\' 4"  (1.626 m)   Wt 161 lb 12.8 oz (73.4 kg)   LMP 11/18/2023   BMI 27.77 kg/m   General: Well-nourished, well-developed in no acute distress.  Lungs: Clear to auscultation bilaterally. Non-labored. Heart: Regular rate and rhythm, no murmurs rubs or gallops.  Abdomen: Bowel sounds are normal; Abdomen is Soft; No hepatosplenomegaly, masses or hernias;  No Abdominal Tenderness; No guarding or rebound tenderness. Neuro: Alert and oriented x 3.  Grossly intact.  Psych: Alert and cooperative, normal mood  and affect.   Imaging Studies: No results found.  Assessment and Plan:   Abigail Hughes is a 38 y.o. y/o female returns for follow-up of abdominal bloating and GERD.  H. pylori breath test was negative.  Celiac panel labs negative.  Normal TSH thyroid test.  She tried low FODMAP diet and omeprazole 20 Mg daily with great benefit.  Currently feeling a lot better with no GI symptoms.  1.  Abdominal bloating and gas  Continue avoiding milk and dairy products.  Continue avoiding mannitol and sugar alcohols.  2.  GERD  Continue OTC Prilosec 20 Mg 1 tablet once daily as needed.  Recommend Lifestyle Modifications to prevent Acid Reflux.  Rec. Avoid coffee, sodas, peppermint, garlic, onions, alcohol, citrus fruits, chocolate, tomatoes, fatty and spicey foods.  Avoid eating 2-3 hours before bedtime.    We discussed adverse side effects of PPIs to include vitamin deficiencies, osteoporosis, renal insufficiency.  Recommend take lowest effective dose of PPI necessary to control acid reflux.  OK to add H2RB (Pepcid 20mg  daily) or antiacid if needed for breakthrough acid reflux.   3.  Colon cancer screening  She is advised to return at age 6 for her first screening colonoscopy.  Celso Amy, PA-C  Follow up as needed if recurrent GI symptoms.

## 2023-11-26 NOTE — Patient Instructions (Signed)
 FYI: Your first screening colonoscopy will be due at AGE 38.  Nice to meet you!  Abigail Hughes

## 2024-01-21 ENCOUNTER — Ambulatory Visit (INDEPENDENT_AMBULATORY_CARE_PROVIDER_SITE_OTHER): Payer: BC Managed Care – PPO | Admitting: Obstetrics and Gynecology

## 2024-01-21 ENCOUNTER — Encounter: Payer: Self-pay | Admitting: Obstetrics and Gynecology

## 2024-01-21 VITALS — BP 122/78 | HR 76 | Temp 97.9°F | Ht 65.2 in | Wt 161.0 lb

## 2024-01-21 DIAGNOSIS — Z1331 Encounter for screening for depression: Secondary | ICD-10-CM | POA: Diagnosis not present

## 2024-01-21 DIAGNOSIS — Z01419 Encounter for gynecological examination (general) (routine) without abnormal findings: Secondary | ICD-10-CM | POA: Diagnosis not present

## 2024-01-21 DIAGNOSIS — E559 Vitamin D deficiency, unspecified: Secondary | ICD-10-CM | POA: Insufficient documentation

## 2024-01-21 DIAGNOSIS — R454 Irritability and anger: Secondary | ICD-10-CM | POA: Diagnosis not present

## 2024-01-21 DIAGNOSIS — E7800 Pure hypercholesterolemia, unspecified: Secondary | ICD-10-CM | POA: Diagnosis not present

## 2024-01-21 DIAGNOSIS — Z90721 Acquired absence of ovaries, unilateral: Secondary | ICD-10-CM | POA: Diagnosis not present

## 2024-01-21 DIAGNOSIS — E785 Hyperlipidemia, unspecified: Secondary | ICD-10-CM | POA: Diagnosis not present

## 2024-01-21 NOTE — Patient Instructions (Signed)

## 2024-01-21 NOTE — Progress Notes (Signed)
 38 y.o. G19P2002 female with known fibroids, status post left oophorectomy (2022-dermoid), migraine with aura here for annual exam. Married, 2 sons- 3&7yo. 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  Patient's last menstrual period was 01/15/2024 (approximate). Period Duration (Days): 2-3 Period Pattern: Regular Menstrual Flow: Light, Heavy (starts out heavy and then light) Menstrual Control: Panty liner, Other (Comment) (cup) Dysmenorrhea: (!) Mild Dysmenorrhea Symptoms: Other (Comment), Headache, Diarrhea (breast pain once)  Reports today that she has noted some irritability and sleep disturbances during her luteal phase.  Wondering about perimenopause and treatment.  Abnormal bleeding: none Pelvic discharge or pain: none Breast mass, nipple discharge or skin changes : none, occasional expression of milk bilaterally Birth control: Condoms, declines contraception Last PAP:     Component Value Date/Time   DIAGPAP  12/14/2022 0950    - Negative for intraepithelial lesion or malignancy (NILM)   DIAGPAP  11/28/2018 0000    NEGATIVE FOR INTRAEPITHELIAL LESIONS OR MALIGNANCY.   DIAGPAP  11/15/2017 0000    NEGATIVE FOR INTRAEPITHELIAL LESIONS OR MALIGNANCY.   HPVHIGH Negative 12/14/2022 0950   ADEQPAP  12/14/2022 0950    Satisfactory for evaluation; transformation zone component PRESENT.   ADEQPAP  11/28/2018 0000    Satisfactory for evaluation  endocervical/transformation zone component PRESENT.   ADEQPAP  11/15/2017 0000    Satisfactory for evaluation  endocervical/transformation zone component PRESENT.   Gardasil: Previously declined Sexually active: Yes Exercising: Yoga and walking Smoker: No  Flowsheet Row Office Visit from 01/21/2024 in [facility]  PHQ-2 Total Score 0       GYN HISTORY: No significant history  OB History  Gravida Para Term Preterm AB Living  2 2 2  0 0 2  SAB IAB Ectopic Multiple Live Births  0 0 0 0 2     # Outcome Date GA Lbr Len/2nd Weight Sex Type Anes PTL Lv  2 Term           1 Term             Obstetric Comments  FTNSVD    Past Medical History:  Diagnosis Date   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 Cristal Deer, office note in epic 05-26-2020, palpitations and sob gestational preg 21 wks,  no further work-up and pt told no issue since   Hx of migraines 2012   no aura   Malpositioned IUD    Umbilical hernia     Past Surgical History:  Procedure Laterality Date   IUD REMOVAL N/A 08/02/2021   Procedure: INTRAUTERINE DEVICE (IUD) REMOVAL;  Surgeon: Romualdo Bolk, MD;  Location: [facility];  Service: Gynecology;  Laterality: N/A;   OOPHORECTOMY Left 08/02/2021   Procedure: LEFT OOPHORECTOMY;  Surgeon: Romualdo Bolk, MD;  Location: [facility];  Service: Gynecology;  Laterality: Left;   ORIF RADIAL FRACTURE Right 05/04/2017   Procedure: OPEN REDUCTION INTERNAL FIXATION (ORIF) RADIAL FRACTURE;  Surgeon: Betha Loa, MD;  Location: [facility];  Service: Orthopedics;  Laterality: Right;  Right radial shaft    UMBILICAL HERNIA REPAIR N/A 08/02/2021   Procedure: UMBILICAL HERNIA REPAIR WITH MESH;  Surgeon:  Stechschulte, Avon Boers, MD;  Location: [facility];  Service: General;  Laterality: N/A;   WISDOM TOOTH EXTRACTION     teen    Current Outpatient Medications on File Prior to Visit  Medication Sig Dispense Refill   cholecalciferol (VITAMIN D3) 25 MCG (1000 UNIT) tablet Take 1,000 Units by mouth daily.     magnesium 30 MG tablet Take 30 mg by mouth daily.     omeprazole (PRILOSEC) 40 MG capsule Take 1 capsule (40 mg total) by mouth daily.  (Patient taking differently: Take 20 mg by mouth as needed.) 30 capsule 1   No current facility-administered medications on file prior to visit.    Social History   Socioeconomic History   Marital status: Married    Spouse name: Malkia Nippert   Number of children: Not on file   Years of education: Masters    Highest education level: Not on file  Occupational History   Occupation: Runner, broadcasting/film/video     Comment: [employer]  Tobacco Use   Smoking status: Never   Smokeless tobacco: Never  Vaping Use   Vaping status: Never Used  Substance and Sexual Activity   Alcohol use: Yes    Alcohol/week: 3.0 standard drinks of alcohol    Types: 3 Standard drinks or equivalent per week    Comment: occ   Drug use: Never   Sexual activity: Yes    Partners: Male    Birth control/protection: Condom  Other Topics Concern   Not on file  Social History Narrative   Not on file   Social Drivers of Health   Financial Resource Strain: Not on file  Food Insecurity: Not on file  Transportation Needs: Not on file  Physical Activity: Not on file  Stress: Not on file  Social Connections: Not on file  Intimate Partner Violence: Not on file    Family History  Problem Relation Age of Onset   Lymphoma Father    Hyperlipidemia Father    Breast cancer Maternal Aunt    Hypertension Maternal Grandmother     No Known Allergies    PE Today's Vitals   01/21/24 1009  BP: 122/78  Pulse: 76  Temp: 97.9 F (36.6 C)  TempSrc: Oral  SpO2: 99%  Weight: 161 lb (73 kg)  Height: 5' 5.2" (1.656 m)   Body mass index is 26.63 kg/m.  Physical Exam Vitals reviewed. Exam conducted with a chaperone present.  Constitutional:      General: She is not in acute distress.    Appearance: Normal appearance.  HENT:     Head: Normocephalic and atraumatic.     Nose: Nose normal.  Eyes:     Extraocular Movements: Extraocular movements intact.     Conjunctiva/sclera: Conjunctivae normal.  Neck:      Thyroid: No thyroid mass, thyromegaly or thyroid tenderness.  Pulmonary:     Effort: Pulmonary effort is normal.  Chest:     Chest wall: No mass or tenderness.  Breasts:    Right: Normal. No swelling, mass, nipple discharge, skin change or tenderness.     Left: Normal. No swelling, mass, nipple discharge, skin change or tenderness.  Abdominal:     General: There is no distension.     Palpations: Abdomen is soft.     Tenderness: There is no abdominal tenderness.  Genitourinary:    General: Normal vulva.     Exam position: Lithotomy position.     Urethra: No prolapse.     Vagina:  Normal. No vaginal discharge or bleeding.     Cervix: Normal. No lesion.     Uterus: Normal. Not enlarged and not tender.      Adnexa: Right adnexa normal and left adnexa normal.  Musculoskeletal:        General: Normal range of motion.     Cervical back: Normal range of motion.  Lymphadenopathy:     Upper Body:     Right upper body: No axillary adenopathy.     Left upper body: No axillary adenopathy.     Lower Body: No right inguinal adenopathy. No left inguinal adenopathy.  Skin:    General: Skin is warm and dry.  Neurological:     General: No focal deficit present.     Mental Status: She is alert.  Psychiatric:        Mood and Affect: Mood normal.        Behavior: Behavior normal.       Assessment and Plan:        Well woman exam with routine gynecological exam Assessment & Plan: Cervical cancer screening performed according to ASCCP guidelines. Labs and immunizations with her primary Encouraged safe sexual practices as indicated Encouraged healthy lifestyle practices with diet and exercise For patients under 50yo, I recommend 1000mg  calcium daily and 600IU of vitamin D daily.   Orders: -     Hemoglobin A1C w/out eAG -     CBC -     COMPLETE METABOLIC PANEL WITHOUT GFR  Negative depression screening  Vitamin D deficiency -     VITAMIN D 25 Hydroxy (Vit-D Deficiency,  Fractures)  Elevated cholesterol -     Hemoglobin A1C w/out eAG -     Lipid panel  Irritability -     Follicle stimulating hormone -     Estradiol  History of left oophorectomy -     Follicle stimulating hormone -     Estradiol  Discussed luteal phase changes in symptoms.  Patient does have risk factors for premature menopause giving left oophorectomy in 2022.  Will check FSH, estrogen levels.  Discussed luteal phase support with estrogen. Patient does have contraindication to COC given history of migraine with aura-currently well-controlled. Could use HRT patch or luteal phase estrogen.  Romaine Closs, MD

## 2024-01-21 NOTE — Assessment & Plan Note (Signed)
 Cervical cancer screening performed according to ASCCP guidelines. Labs and immunizations with her primary Encouraged safe sexual practices as indicated Encouraged healthy lifestyle practices with diet and exercise For patients under 39yo, I recommend 1000mg  calcium daily and 600IU of vitamin D daily.

## 2024-01-22 LAB — CBC
HCT: 43 % (ref 35.0–45.0)
Hemoglobin: 14 g/dL (ref 11.7–15.5)
MCH: 30 pg (ref 27.0–33.0)
MCHC: 32.6 g/dL (ref 32.0–36.0)
MCV: 92.3 fL (ref 80.0–100.0)
MPV: 10.9 fL (ref 7.5–12.5)
Platelets: 316 10*3/uL (ref 140–400)
RBC: 4.66 10*6/uL (ref 3.80–5.10)
RDW: 12.9 % (ref 11.0–15.0)
WBC: 4.9 10*3/uL (ref 3.8–10.8)

## 2024-01-22 LAB — ESTRADIOL: Estradiol: 46 pg/mL

## 2024-01-22 LAB — COMPLETE METABOLIC PANEL WITHOUT GFR
AG Ratio: 2.1 (calc) (ref 1.0–2.5)
ALT: 13 U/L (ref 6–29)
AST: 15 U/L (ref 10–30)
Albumin: 4.8 g/dL (ref 3.6–5.1)
Alkaline phosphatase (APISO): 92 U/L (ref 31–125)
BUN: 11 mg/dL (ref 7–25)
CO2: 27 mmol/L (ref 20–32)
Calcium: 9.7 mg/dL (ref 8.6–10.2)
Chloride: 106 mmol/L (ref 98–110)
Creat: 0.6 mg/dL (ref 0.50–0.97)
Globulin: 2.3 g/dL (ref 1.9–3.7)
Glucose, Bld: 92 mg/dL (ref 65–99)
Potassium: 4.6 mmol/L (ref 3.5–5.3)
Sodium: 139 mmol/L (ref 135–146)
Total Bilirubin: 0.4 mg/dL (ref 0.2–1.2)
Total Protein: 7.1 g/dL (ref 6.1–8.1)

## 2024-01-22 LAB — LIPID PANEL
Cholesterol: 258 mg/dL — ABNORMAL HIGH (ref <200)
HDL: 77 mg/dL (ref >=50)
LDL Cholesterol (Calc): 162 mg/dL — ABNORMAL HIGH
Non-HDL Cholesterol (Calc): 181 mg/dL — ABNORMAL HIGH (ref <130)
Total CHOL/HDL Ratio: 3.4 (calc) (ref <5.0)
Triglycerides: 84 mg/dL (ref <150)

## 2024-01-22 LAB — HEMOGLOBIN A1C W/OUT EAG: Hgb A1c MFr Bld: 5.4 % (ref <5.7)

## 2024-01-22 LAB — FOLLICLE STIMULATING HORMONE: FSH: 5.2 m[IU]/mL

## 2024-01-22 LAB — VITAMIN D 25 HYDROXY (VIT D DEFICIENCY, FRACTURES): Vit D, 25-Hydroxy: 29 ng/mL — ABNORMAL LOW (ref 30–100)

## 2024-01-28 ENCOUNTER — Encounter: Payer: Self-pay | Admitting: Obstetrics and Gynecology

## 2024-02-21 DIAGNOSIS — L821 Other seborrheic keratosis: Secondary | ICD-10-CM | POA: Diagnosis not present

## 2024-02-21 DIAGNOSIS — L578 Other skin changes due to chronic exposure to nonionizing radiation: Secondary | ICD-10-CM | POA: Diagnosis not present

## 2024-02-21 DIAGNOSIS — L814 Other melanin hyperpigmentation: Secondary | ICD-10-CM | POA: Diagnosis not present

## 2024-02-21 DIAGNOSIS — D229 Melanocytic nevi, unspecified: Secondary | ICD-10-CM | POA: Diagnosis not present

## 2025-01-21 ENCOUNTER — Ambulatory Visit: Admitting: Obstetrics and Gynecology
